# Patient Record
Sex: Female | Born: 2000 | Race: Black or African American | Hispanic: No | Marital: Single | Smoking: Never smoker
Health system: Southern US, Community
[De-identification: ages and names within clinical notes are randomized; demographics above are authoritative.]

## PROBLEM LIST (undated history)

## (undated) DIAGNOSIS — I10 Essential (primary) hypertension: Secondary | ICD-10-CM

## (undated) DIAGNOSIS — F419 Anxiety disorder, unspecified: Secondary | ICD-10-CM

## (undated) DIAGNOSIS — J45909 Unspecified asthma, uncomplicated: Secondary | ICD-10-CM

## (undated) HISTORY — PX: NO PAST SURGERIES: SHX2092

---
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
## 2020-01-19 ENCOUNTER — Other Ambulatory Visit: Payer: Medicaid Other

## 2020-01-26 ENCOUNTER — Other Ambulatory Visit: Payer: Self-pay

## 2020-01-26 ENCOUNTER — Emergency Department: Payer: Medicaid Other

## 2020-01-26 ENCOUNTER — Emergency Department
Admission: EM | Admit: 2020-01-26 | Discharge: 2020-01-27 | Disposition: A | Discharge status: Home / Self Care | Payer: Medicaid Other | Attending: Emergency Medicine | Admitting: Emergency Medicine

## 2020-01-26 ENCOUNTER — Encounter: Payer: Self-pay | Admitting: Emergency Medicine

## 2020-01-26 DIAGNOSIS — S299XXA Unspecified injury of thorax, initial encounter: Secondary | ICD-10-CM | POA: Diagnosis not present

## 2020-01-26 DIAGNOSIS — S0093XA Contusion of unspecified part of head, initial encounter: Secondary | ICD-10-CM | POA: Insufficient documentation

## 2020-01-26 DIAGNOSIS — S3991XA Unspecified injury of abdomen, initial encounter: Secondary | ICD-10-CM | POA: Diagnosis not present

## 2020-01-26 DIAGNOSIS — Y939 Activity, unspecified: Secondary | ICD-10-CM | POA: Diagnosis not present

## 2020-01-26 DIAGNOSIS — N939 Abnormal uterine and vaginal bleeding, unspecified: Secondary | ICD-10-CM | POA: Insufficient documentation

## 2020-01-26 DIAGNOSIS — S0083XA Contusion of other part of head, initial encounter: Secondary | ICD-10-CM

## 2020-01-26 DIAGNOSIS — Y999 Unspecified external cause status: Secondary | ICD-10-CM | POA: Diagnosis not present

## 2020-01-26 DIAGNOSIS — Z79899 Other long term (current) drug therapy: Secondary | ICD-10-CM | POA: Insufficient documentation

## 2020-01-26 DIAGNOSIS — Y929 Unspecified place or not applicable: Secondary | ICD-10-CM | POA: Diagnosis not present

## 2020-01-26 DIAGNOSIS — S0993XA Unspecified injury of face, initial encounter: Secondary | ICD-10-CM | POA: Diagnosis present

## 2020-01-26 LAB — URINALYSIS, COMPLETE (UACMP) WITH MICROSCOPIC
Bacteria, UA: NONE SEEN
Bilirubin Urine: NEGATIVE
Glucose, UA: NEGATIVE mg/dL
Ketones, ur: NEGATIVE mg/dL
Nitrite: NEGATIVE
Protein, ur: 30 mg/dL — AB
Specific Gravity, Urine: 1.031 — ABNORMAL HIGH (ref 1.005–1.030)
pH: 6 (ref 5.0–8.0)

## 2020-01-26 LAB — COMPREHENSIVE METABOLIC PANEL
ALT: 19 U/L (ref 0–44)
AST: 21 U/L (ref 15–41)
Albumin: 3.8 g/dL (ref 3.5–5.0)
Alkaline Phosphatase: 31 U/L — ABNORMAL LOW (ref 38–126)
Anion gap: 6 (ref 5–15)
BUN: 15 mg/dL (ref 6–20)
CO2: 23 mmol/L (ref 22–32)
Calcium: 8.8 mg/dL — ABNORMAL LOW (ref 8.9–10.3)
Chloride: 112 mmol/L — ABNORMAL HIGH (ref 98–111)
Creatinine, Ser: 0.67 mg/dL (ref 0.44–1.00)
GFR calc Af Amer: >60 mL/min (ref >60)
GFR calc non Af Amer: >60 mL/min (ref >60)
Glucose, Bld: 128 mg/dL — ABNORMAL HIGH (ref 70–99)
Potassium: 3.7 mmol/L (ref 3.5–5.1)
Sodium: 141 mmol/L (ref 135–145)
Total Bilirubin: 0.6 mg/dL (ref 0.3–1.2)
Total Protein: 6.2 g/dL — ABNORMAL LOW (ref 6.5–8.1)

## 2020-01-26 LAB — CBC
HCT: 38.5 % (ref 36.0–46.0)
Hemoglobin: 12.9 g/dL (ref 12.0–15.0)
MCH: 29.4 pg (ref 26.0–34.0)
MCHC: 33.5 g/dL (ref 30.0–36.0)
MCV: 87.7 fL (ref 80.0–100.0)
Platelets: 272 10*3/uL (ref 150–400)
RBC: 4.39 MIL/uL (ref 3.87–5.11)
RDW: 12.4 % (ref 11.5–15.5)
WBC: 8.9 10*3/uL (ref 4.0–10.5)
nRBC: 0 % (ref 0.0–0.2)

## 2020-01-26 LAB — POCT PREGNANCY, URINE: Preg Test, Ur: NEGATIVE

## 2020-01-26 LAB — LIPASE, BLOOD: Lipase: 37 U/L (ref 11–51)

## 2020-01-26 MED ORDER — IBUPROFEN 800 MG PO TABS
800.0000 mg | ORAL_TABLET | Freq: Once | ORAL | Status: AC
  Administered 2020-01-27: 800 mg via ORAL
  Filled 2020-01-26: qty 1

## 2020-01-26 MED ORDER — FOSFOMYCIN TROMETHAMINE 3 G PO PACK
3.0000 g | PACK | Freq: Once | ORAL | Status: AC
  Administered 2020-01-27: 3 g via ORAL
  Filled 2020-01-26: qty 3

## 2020-01-26 MED ORDER — IOHEXOL 300 MG/ML  SOLN
100.0000 mL | Freq: Once | INTRAMUSCULAR | Status: AC | PRN
  Administered 2020-01-26: 100 mL via INTRAVENOUS

## 2020-01-26 MED ORDER — HYDROCODONE-ACETAMINOPHEN 5-325 MG PO TABS
1.0000 | ORAL_TABLET | Freq: Once | ORAL | Status: AC
  Administered 2020-01-27: 1 via ORAL
  Filled 2020-01-26: qty 1
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
## 2020-01-27 MED ORDER — HYDROCODONE-ACETAMINOPHEN 5-325 MG PO TABS: 1.0000 | ORAL_TABLET | Freq: Four times a day (QID) | ORAL | 0 refills | Status: DC | PRN

## 2020-01-27 MED ORDER — IBUPROFEN 800 MG PO TABS
800.0000 mg | ORAL_TABLET | Freq: Three times a day (TID) | ORAL | 0 refills | Status: DC | PRN
Start: 2020-01-27 — End: 2020-04-27

## 2020-01-27 NOTE — Discharge Instructions (Signed)
You may take pain medicines as needed (Motrin/Norco #15). Apply ice to affected areas several times daily as needed to reduce swelling. Return to the ER for worsening symptoms, persistent vomiting, lethargy, difficulty breathing or other concerns.

## 2020-02-25 ENCOUNTER — Encounter: Payer: Medicaid Other | Admitting: Obstetrics & Gynecology

## 2020-03-12 ENCOUNTER — Encounter: Payer: Medicaid Other | Admitting: Obstetrics & Gynecology

## 2020-04-26 ENCOUNTER — Encounter: Payer: Self-pay | Admitting: Emergency Medicine

## 2020-04-26 ENCOUNTER — Other Ambulatory Visit: Payer: Self-pay

## 2020-04-26 DIAGNOSIS — O468X1 Other antepartum hemorrhage, first trimester: Secondary | ICD-10-CM | POA: Diagnosis not present

## 2020-04-26 DIAGNOSIS — Z3A09 9 weeks gestation of pregnancy: Secondary | ICD-10-CM | POA: Diagnosis not present

## 2020-04-26 DIAGNOSIS — M791 Myalgia, unspecified site: Secondary | ICD-10-CM | POA: Diagnosis not present

## 2020-04-26 DIAGNOSIS — R109 Unspecified abdominal pain: Secondary | ICD-10-CM | POA: Insufficient documentation

## 2020-04-26 DIAGNOSIS — R0981 Nasal congestion: Secondary | ICD-10-CM | POA: Diagnosis not present

## 2020-04-26 DIAGNOSIS — O418X10 Other specified disorders of amniotic fluid and membranes, first trimester, not applicable or unspecified: Secondary | ICD-10-CM | POA: Insufficient documentation

## 2020-04-26 DIAGNOSIS — J45909 Unspecified asthma, uncomplicated: Secondary | ICD-10-CM | POA: Diagnosis not present

## 2020-04-26 DIAGNOSIS — Z20822 Contact with and (suspected) exposure to covid-19: Secondary | ICD-10-CM | POA: Insufficient documentation

## 2020-04-26 DIAGNOSIS — O219 Vomiting of pregnancy, unspecified: Secondary | ICD-10-CM | POA: Diagnosis present

## 2020-04-26 LAB — URINALYSIS, COMPLETE (UACMP) WITH MICROSCOPIC
Bacteria, UA: NONE SEEN
Bilirubin Urine: NEGATIVE
Glucose, UA: NEGATIVE mg/dL
Hgb urine dipstick: NEGATIVE
Ketones, ur: NEGATIVE mg/dL
Nitrite: NEGATIVE
Protein, ur: NEGATIVE mg/dL
Specific Gravity, Urine: 1.029 (ref 1.005–1.030)
pH: 5 (ref 5.0–8.0)

## 2020-04-26 LAB — COMPREHENSIVE METABOLIC PANEL
ALT: 17 U/L (ref 0–44)
AST: 15 U/L (ref 15–41)
Albumin: 3.9 g/dL (ref 3.5–5.0)
Alkaline Phosphatase: 25 U/L — ABNORMAL LOW (ref 38–126)
Anion gap: 9 (ref 5–15)
BUN: 12 mg/dL (ref 6–20)
CO2: 23 mmol/L (ref 22–32)
Calcium: 9 mg/dL (ref 8.9–10.3)
Chloride: 106 mmol/L (ref 98–111)
Creatinine, Ser: 0.47 mg/dL (ref 0.44–1.00)
GFR calc Af Amer: >60 mL/min (ref >60)
GFR calc non Af Amer: >60 mL/min (ref >60)
Glucose, Bld: 91 mg/dL (ref 70–99)
Potassium: 3.3 mmol/L — ABNORMAL LOW (ref 3.5–5.1)
Sodium: 138 mmol/L (ref 135–145)
Total Bilirubin: 0.9 mg/dL (ref 0.3–1.2)
Total Protein: 6.6 g/dL (ref 6.5–8.1)

## 2020-04-26 LAB — CBC
HCT: 33.6 % — ABNORMAL LOW (ref 36.0–46.0)
Hemoglobin: 11.3 g/dL — ABNORMAL LOW (ref 12.0–15.0)
MCH: 29.6 pg (ref 26.0–34.0)
MCHC: 33.6 g/dL (ref 30.0–36.0)
MCV: 88 fL (ref 80.0–100.0)
Platelets: 223 10*3/uL (ref 150–400)
RBC: 3.82 MIL/uL — ABNORMAL LOW (ref 3.87–5.11)
RDW: 13.2 % (ref 11.5–15.5)
WBC: 7.4 10*3/uL (ref 4.0–10.5)
nRBC: 0 % (ref 0.0–0.2)

## 2020-04-26 LAB — LIPASE, BLOOD: Lipase: 42 U/L (ref 11–51)

## 2020-04-26 LAB — POCT PREGNANCY, URINE: Preg Test, Ur: POSITIVE — AB

## 2020-04-26 NOTE — ED Triage Notes (Signed)
Pt to ED from home c/o abd pain and n/v x4-5 months.  States had vaginal bleeding x1 hr last month but prior to that unsure when last period.  States sexually active, unsure if pregnant and has not taken pregnancy test.  States nauseous throughout the day and unable to keep food down.

## 2020-04-27 ENCOUNTER — Emergency Department: Payer: Medicaid Other

## 2020-04-27 ENCOUNTER — Telehealth: Payer: Self-pay | Admitting: Emergency Medicine

## 2020-04-27 ENCOUNTER — Emergency Department
Admission: EM | Admit: 2020-04-27 | Discharge: 2020-04-27 | Disposition: A | Discharge status: Home / Self Care | Payer: Medicaid Other | Attending: Emergency Medicine | Admitting: Emergency Medicine

## 2020-04-27 ENCOUNTER — Encounter: Payer: Self-pay | Admitting: Radiology

## 2020-04-27 DIAGNOSIS — O4690 Antepartum hemorrhage, unspecified, unspecified trimester: Secondary | ICD-10-CM

## 2020-04-27 DIAGNOSIS — Z20822 Contact with and (suspected) exposure to covid-19: Secondary | ICD-10-CM

## 2020-04-27 DIAGNOSIS — O418X10 Other specified disorders of amniotic fluid and membranes, first trimester, not applicable or unspecified: Secondary | ICD-10-CM

## 2020-04-27 DIAGNOSIS — O468X1 Other antepartum hemorrhage, first trimester: Secondary | ICD-10-CM

## 2020-04-27 DIAGNOSIS — Z3A09 9 weeks gestation of pregnancy: Secondary | ICD-10-CM

## 2020-04-27 LAB — SARS CORONAVIRUS 2 BY RT PCR (HOSPITAL ORDER, PERFORMED IN ~~LOC~~ HOSPITAL LAB): SARS Coronavirus 2: NEGATIVE

## 2020-04-27 LAB — CHLAMYDIA/NGC RT PCR (ARMC ONLY)
Chlamydia Tr: DETECTED — AB
N gonorrhoeae: NOT DETECTED

## 2020-04-27 LAB — HEPATITIS PANEL, ACUTE
HCV Ab: NONREACTIVE
Hep A IgM: NONREACTIVE
Hep B C IgM: NONREACTIVE
Hepatitis B Surface Ag: NONREACTIVE

## 2020-04-27 LAB — ABO/RH: ABO/RH(D): B POS

## 2020-04-27 LAB — HIV ANTIBODY (ROUTINE TESTING W REFLEX): HIV Screen 4th Generation wRfx: NONREACTIVE

## 2020-04-27 LAB — HCG, QUANTITATIVE, PREGNANCY: hCG, Beta Chain, Quant, S: 43150 m[IU]/mL — ABNORMAL HIGH (ref <5)

## 2020-04-27 MED ORDER — PRENATAL VITAMIN 27-0.8 MG PO TABS: 1.0000 | ORAL_TABLET | Freq: Every day | ORAL | 0 refills | Status: DC

## 2020-04-27 MED ORDER — DOXYLAMINE-PYRIDOXINE 10-10 MG PO TBEC: 1.0000 | DELAYED_RELEASE_TABLET | Freq: Two times a day (BID) | ORAL | 0 refills | Status: DC

## 2020-04-27 NOTE — ED Notes (Signed)
Dr Katrinka Blazing at in triage to see pt.

## 2020-04-27 NOTE — ED Notes (Addendum)
Dr Katrinka Blazing to triage to see pt. Called pt, checked outside and bathroom unable to find.

## 2020-04-27 NOTE — Telephone Encounter (Signed)
Called patient to inform of positive chlamydia test and need for treatment.  Per dr Antoine Primas, call in azithromycin 1 gram to patient's pharmacy.  She did not answer and no voicemail.  Will try again.

## 2020-04-27 NOTE — ED Notes (Signed)
Pt being DC from triage area after Dr. Katrinka Blazing saw pt. E-signature page printed out and pt signed on paper while will be sent to medical records to be added to chart.

## 2020-04-27 NOTE — Telephone Encounter (Signed)
Called patient again regarding chlamydia treatment.  No answer and no voicemail.

## 2020-04-27 NOTE — ED Provider Notes (Signed)
[facility] Emergency Department Provider Note  ____________________________________________   First MD Initiated Contact with Patient 04/27/20 [id number]     (approximate)  I have reviewed the triage vital signs and the nursing notes.   HISTORY  Chief Complaint Abdominal Pain, Nausea, and Emesis   HPI Katrina Sherman is a 19 y.o. female with a past medical history of anxiety and anemia who presents for assessment of approximately 1 month of daily nausea and vomiting associated with some bilateral lower back soreness.  Patient also notes she has had some congestion and nonproductive cough over the last several weeks.  No prior similar episodes.  No clear alleviating or aggravating factors.  Patient states she had some vaginal bleeding about 1 month ago but none since then.  She states she is never been pregnant and is not aware if she is pregnant or not.  She states she has been taking some ibuprofen but this has not helped.  She denies any fevers, chills, cough, shortness of breath, chest pain, abdominal pain, vaginal bleeding, discharge, blood in her stool, rash, extremity pain, or other acute complaints.  Denies EtOH use, illegal drug use, or tobacco abuse.  States she is currently's sexually active with one partner.         Past Medical History:  Diagnosis Date  . Anxiety   . Asthma     There are no problems to display for this patient.   History reviewed. No pertinent surgical history.  Prior to Admission medications   Medication Sig Start Date End Date Taking? Authorizing Provider  cetirizine (ZYRTEC ALLERGY) 10 MG tablet Take 1 tablet (10 mg total) by mouth daily for 14 days. 11/19/19 12/03/19  Miguel Aschoff., MD  diphenhydrAMINE (BENADRYL ALLERGY) 25 mg capsule Take 1 capsule (25 mg total) by mouth every 6 (six) hours as needed for itching or allergies. 11/19/19   Miguel Aschoff., MD  Doxylamine-Pyridoxine (DICLEGIS) 10-10 MG TBEC Take 1 tablet by mouth in  the morning and at bedtime. 04/27/20   Gilles Chiquito, MD  Prenatal Vit-Fe Fumarate-FA (PRENATAL VITAMIN) 27-0.8 MG TABS Take 1 tablet by mouth daily. 04/27/20   Gilles Chiquito, MD  famotidine (PEPCID) 20 MG tablet Take 1 tablet (20 mg total) by mouth 2 (two) times daily for 5 days. 05/17/19 09/24/19  Enid Derry, PA-C    Allergies Kiwi extract, Other, and Pollen extract  History reviewed. No pertinent family history.  Social History Social History   Tobacco Use  . Smoking status: Never Smoker  . Smokeless tobacco: Never Used  Substance Use Topics  . Alcohol use: Never  . Drug use: Never    Review of Systems  Review of Systems  Constitutional: Negative for chills and fever.  HENT: Positive for congestion. Negative for sore throat.   Eyes: Negative for pain.  Respiratory: Positive for cough. Negative for stridor.   Cardiovascular: Negative for chest pain.  Gastrointestinal: Positive for nausea and vomiting.  Musculoskeletal: Positive for myalgias ( b/l lower back ).  Skin: Negative for rash.  Neurological: Negative for seizures, loss of consciousness and headaches.  Psychiatric/Behavioral: Negative for suicidal ideas.  All other systems reviewed and are negative.     ____________________________________________   PHYSICAL EXAM:  VITAL SIGNS: ED Triage Vitals  Enc Vitals Group     BP 04/26/20 2235 123/70     Pulse Rate 04/26/20 2235 82     Resp 04/26/20 2235 20     Temp 04/26/20 2235 98.3  F (36.8 C)     Temp Source 04/26/20 2235 Oral     SpO2 04/26/20 2235 98 %     Weight 04/26/20 2235 229 lb 8 oz (104.1 kg)     Height --      Head Circumference --      Peak Flow --      Pain Score 04/26/20 2246 8     Pain Loc --      Pain Edu? --      Excl. in GC? --    Vitals:   04/27/20 0709 04/27/20 0830  BP: (!) 104/93 115/69  Pulse: 84 77  Resp: 18 16  Temp: 98.7 F (37.1 C) 99.5 F (37.5 C)  SpO2: 98% 98%   Physical Exam Vitals and nursing note  reviewed.  Constitutional:      General: She is not in acute distress.    Appearance: Normal appearance. She is well-developed and normal weight.  HENT:     Head: Normocephalic and atraumatic.     Right Ear: External ear normal.     Left Ear: External ear normal.     Nose: Nose normal.     Mouth/Throat:     Mouth: Mucous membranes are moist.  Eyes:     Conjunctiva/sclera: Conjunctivae normal.  Cardiovascular:     Rate and Rhythm: Normal rate and regular rhythm.     Pulses: Normal pulses.     Heart sounds: No murmur heard.   Pulmonary:     Effort: Pulmonary effort is normal. No respiratory distress.     Breath sounds: Normal breath sounds.  Abdominal:     Palpations: Abdomen is soft.     Tenderness: There is no abdominal tenderness. There is no right CVA tenderness or left CVA tenderness.  Musculoskeletal:     Cervical back: Neck supple.  Skin:    General: Skin is warm and dry.  Neurological:     Mental Status: She is alert and oriented to person, place, and time.  Psychiatric:        Mood and Affect: Mood normal.      ____________________________________________   LABS (all labs ordered are listed, but only abnormal results are displayed)  Labs Reviewed  COMPREHENSIVE METABOLIC PANEL - Abnormal; Notable for the following components:      Result Value   Potassium 3.3 (*)    Alkaline Phosphatase 25 (*)    All other components within normal limits  CBC - Abnormal; Notable for the following components:   RBC 3.82 (*)    Hemoglobin 11.3 (*)    HCT 33.6 (*)    All other components within normal limits  URINALYSIS, COMPLETE (UACMP) WITH MICROSCOPIC - Abnormal; Notable for the following components:   Color, Urine YELLOW (*)    APPearance HAZY (*)    Leukocytes,Ua TRACE (*)    All other components within normal limits  HCG, QUANTITATIVE, PREGNANCY - Abnormal; Notable for the following components:   hCG, Beta Chain, Quant, S 43,150 (*)    All other components within  normal limits  POCT PREGNANCY, URINE - Abnormal; Notable for the following components:   Preg Test, Ur POSITIVE (*)    All other components within normal limits  SARS CORONAVIRUS 2 BY RT PCR (HOSPITAL ORDER, PERFORMED IN [facility])  CHLAMYDIA/NGC RT PCR ([facility] ONLY)  LIPASE, BLOOD  HIV ANTIBODY (ROUTINE TESTING W REFLEX)  RPR  HEPATITIS PANEL, ACUTE  POC URINE PREG, ED  ABO/RH  RADIOLOGY   Official radiology report(s): US OB LESS THAN 14 WEEKS WITH OB TRANSVAGINAL  Result Date: 04/27/2020 CLINICAL DATA:  Vaginal bleeding. EXAM: OBSTETRIC <14 WK US AND TRANSVAGINAL OB US TECHNIQUE: Both transabdominal and transvaginal ultrasound examinations were performed for complete evaluation of the gestation as well as the maternal uterus, adnexal regions, and pelvic cul-de-sac. Transvaginal technique was performed to assess early pregnancy. COMPARISON:  CT 01/26/2020. FINDINGS: Intrauterine gestational sac: Present Yolk sac:  Present Embryo:  Present Cardiac Activity: Present Heart Rate: 162 bpm CRL: 28.4 mm   9 w   5 d                  US EDC: 11/26/2019 Subchorionic hemorrhage: Two small separate subchorionic hemorrhages are noted one measuring 3.2 cm in maximum diameter the other measuring 2.1 cm. Maternal uterus/adnexae: Ovaries appear normal. Trace free pelvic fluid. IMPRESSION: 1.  Single viable intrauterine pregnancy at 9 weeks 5 days. 2. Two small separate subchorionic hemorrhages are noted one measuring 3.2 cm in maximum diameter, the other measuring 2.1 cm. Follow-up exams can be obtained to demonstrate resolution. 3.  Trace free pelvic fluid. Electronically Signed   By: Maisie Fushomas  Register   On: 04/27/2020 06:16    ____________________________________________   PROCEDURES  Procedure(s) performed (including Critical Care):  Procedures   ____________________________________________   INITIAL IMPRESSION / ASSESSMENT AND PLAN / ED COURSE        Patient  presents with above-stated history exam for assessment of nausea and vomiting associated with some soreness in her lower back over the past month.  Afebrile and hemodynamically stable on arrival.  Exam as above.  Overall patient's history, exam, and ED work-up is most consistent with a viable IUP at 9 weeks and 5 days by ultrasound measurement associated with 2 small subchorionic hemorrhages.  Patient does endorse history of nausea and vomiting with past month she does not appear to be clinically dehydrated on exam and has been able to tolerate p.o. between vomiting episodes.  Patient is noted to be mildly hypokalemic but otherwise has no significant electrolyte or metabolic derangements.  UA is not consistent with infectious process and there are no ketones noted.  Patient is noted to have a nonproductive cough and congestion over the last couple weeks lung sounds are unremarkable she has no fever or other findings on history exam to suggest bacterial pneumonia.  Likely URI.  COVID-19 PCR sent.    Discussed at length with the patient her pregnancy as well as findings of subchorionic hemorrhage.  Routine counseling provided by myself including cessation of all NSAIDs, initiating prenatal vitamins, and using Diclegis as needed for nausea and vomiting.  Discussed importance of obtaining close outpatient OB/GYN follow-up.  Discussed avoiding tobacco and EtOH use.  Discussed that we will send routine STD testing today but that she should follow-up with her OB/GYN the results of the studies.   ____________________________________________   FINAL CLINICAL IMPRESSION(S) / ED DIAGNOSES  Final diagnoses:  [redacted] weeks gestation of pregnancy  Subchorionic hemorrhage of placenta in first trimester, single or unspecified fetus  Person under investigation for COVID-19     ED Discharge Orders         Ordered    Prenatal Vit-Fe Fumarate-FA (PRENATAL VITAMIN) 27-0.8 MG TABS  Daily     Discontinue  Reprint      04/27/20 0823    Doxylamine-Pyridoxine (DICLEGIS) 10-10 MG TBEC  2 times daily     Discontinue  Reprint  04/27/20 0827           Note:  This document was prepared using Dragon voice recognition software and may include unintentional dictation errors.   Gilles Chiquito, MD 04/27/20 [phone]

## 2020-04-29 LAB — RPR: RPR Ser Ql: NONREACTIVE — AB

## 2020-05-01 ENCOUNTER — Observation Stay
Admission: EM | Admit: 2020-05-01 | Discharge: 2020-05-01 | Disposition: A | Discharge status: Home / Self Care | Payer: Medicaid Other | Attending: Obstetrics and Gynecology | Admitting: Obstetrics and Gynecology

## 2020-05-01 ENCOUNTER — Encounter: Payer: Self-pay | Admitting: *Deleted

## 2020-05-01 ENCOUNTER — Emergency Department
Admission: EM | Admit: 2020-05-01 | Discharge: 2020-05-01 | Disposition: A | Discharge status: Home / Self Care | Payer: Medicaid Other | Attending: Emergency Medicine | Admitting: Emergency Medicine

## 2020-05-01 DIAGNOSIS — O219 Vomiting of pregnancy, unspecified: Secondary | ICD-10-CM | POA: Diagnosis present

## 2020-05-01 DIAGNOSIS — O360131 Maternal care for anti-D [Rh] antibodies, third trimester, fetus 1: Secondary | ICD-10-CM | POA: Insufficient documentation

## 2020-05-01 DIAGNOSIS — O99013 Anemia complicating pregnancy, third trimester: Secondary | ICD-10-CM | POA: Insufficient documentation

## 2020-05-01 DIAGNOSIS — D649 Anemia, unspecified: Secondary | ICD-10-CM | POA: Insufficient documentation

## 2020-05-01 DIAGNOSIS — F909 Attention-deficit hyperactivity disorder, unspecified type: Secondary | ICD-10-CM | POA: Diagnosis not present

## 2020-05-01 DIAGNOSIS — Z79899 Other long term (current) drug therapy: Secondary | ICD-10-CM | POA: Insufficient documentation

## 2020-05-01 DIAGNOSIS — Z6791 Unspecified blood type, Rh negative: Secondary | ICD-10-CM | POA: Diagnosis not present

## 2020-05-01 DIAGNOSIS — I10 Essential (primary) hypertension: Secondary | ICD-10-CM | POA: Diagnosis not present

## 2020-05-01 DIAGNOSIS — O99810 Abnormal glucose complicating pregnancy: Secondary | ICD-10-CM | POA: Insufficient documentation

## 2020-05-01 DIAGNOSIS — O99323 Drug use complicating pregnancy, third trimester: Secondary | ICD-10-CM | POA: Insufficient documentation

## 2020-05-01 DIAGNOSIS — O99343 Other mental disorders complicating pregnancy, third trimester: Secondary | ICD-10-CM | POA: Diagnosis not present

## 2020-05-01 DIAGNOSIS — O210 Mild hyperemesis gravidarum: Secondary | ICD-10-CM | POA: Insufficient documentation

## 2020-05-01 DIAGNOSIS — F411 Generalized anxiety disorder: Secondary | ICD-10-CM | POA: Insufficient documentation

## 2020-05-01 DIAGNOSIS — F329 Major depressive disorder, single episode, unspecified: Secondary | ICD-10-CM | POA: Diagnosis not present

## 2020-05-01 DIAGNOSIS — J45909 Unspecified asthma, uncomplicated: Secondary | ICD-10-CM | POA: Insufficient documentation

## 2020-05-01 DIAGNOSIS — O26853 Spotting complicating pregnancy, third trimester: Secondary | ICD-10-CM | POA: Diagnosis not present

## 2020-05-01 DIAGNOSIS — F1210 Cannabis abuse, uncomplicated: Secondary | ICD-10-CM | POA: Insufficient documentation

## 2020-05-01 DIAGNOSIS — Z3A35 35 weeks gestation of pregnancy: Secondary | ICD-10-CM | POA: Diagnosis not present

## 2020-05-01 DIAGNOSIS — Z3A09 9 weeks gestation of pregnancy: Secondary | ICD-10-CM | POA: Diagnosis not present

## 2020-05-01 DIAGNOSIS — Z3490 Encounter for supervision of normal pregnancy, unspecified, unspecified trimester: Secondary | ICD-10-CM

## 2020-05-01 HISTORY — DX: Essential (primary) hypertension: I10

## 2020-05-01 MED ORDER — PROMETHAZINE HCL 12.5 MG PO TABS
12.5000 mg | ORAL_TABLET | Freq: Four times a day (QID) | ORAL | 0 refills | Status: DC | PRN
Start: 2020-05-01 — End: 2021-11-24

## 2020-05-01 NOTE — ED Triage Notes (Signed)
Patient sent back to ED from [facility] for evaluation by emergency room. Patient initially stated she had EDC of 08/16 however it was determined she was less than 20 weeks. Patient wants to be evaluated for nausea and vomiting.

## 2020-05-01 NOTE — ED Provider Notes (Signed)
[facility] Emergency Department Provider Note   ____________________________________________    I have reviewed the triage vital signs and the nursing notes.   HISTORY  Chief Complaint Nausea vomiting    HPI Katrina Sherman is a 19 y.o. female who reports she is approximately [redacted] weeks pregnant who presents with complaints of nausea vomiting.  Patient reports symptoms started last week.  Recently seen in the emergency department, had ultrasound performed during that time.  Was prescribed likely just she reports this has not been fully successful for her.  She does not have appointment with [facility] until next month.  No significant abdominal pain this time.  No vaginal bleeding currently.  Past Medical History:  Diagnosis Date  . Anxiety   . Asthma   . Hypertension     Patient Active Problem List   Diagnosis Date Noted  . Pregnancy 05/01/2020    History reviewed. No pertinent surgical history.  Prior to Admission medications   Medication Sig Start Date End Date Taking? Authorizing Provider  cetirizine (ZYRTEC ALLERGY) 10 MG tablet Take 1 tablet (10 mg total) by mouth daily for 14 days. 11/19/19 12/03/19  Miguel Aschoff., MD  diphenhydrAMINE (BENADRYL ALLERGY) 25 mg capsule Take 1 capsule (25 mg total) by mouth every 6 (six) hours as needed for itching or allergies. 11/19/19   Miguel Aschoff., MD  Doxylamine-Pyridoxine (DICLEGIS) 10-10 MG TBEC Take 1 tablet by mouth in the morning and at bedtime. 04/27/20   Gilles Chiquito, MD  Prenatal Vit-Fe Fumarate-FA (PRENATAL VITAMIN) 27-0.8 MG TABS Take 1 tablet by mouth daily. 04/27/20   Gilles Chiquito, MD  promethazine (PHENERGAN) 12.5 MG tablet Take 1 tablet (12.5 mg total) by mouth every 6 (six) hours as needed for nausea or vomiting. 05/01/20   Jene Every, MD  famotidine (PEPCID) 20 MG tablet Take 1 tablet (20 mg total) by mouth 2 (two) times daily for 5 days. 05/17/19 09/24/19  Enid Derry, PA-C      Allergies Kiwi extract, Other, and Pollen extract  History reviewed. No pertinent family history.  Social History Social History   Tobacco Use  . Smoking status: Never Smoker  . Smokeless tobacco: Never Used  Substance Use Topics  . Alcohol use: Never  . Drug use: Never    Review of Systems  Constitutional: No fever/chills Eyes: No visual changes.  ENT: No sore throat. Cardiovascular: Denies chest pain. Respiratory: Denies shortness of breath. Gastrointestinal: As above Genitourinary: As above Musculoskeletal: Negative for back pain. Skin: Negative for rash. Neurological: Negative for headaches or weakness   ____________________________________________   PHYSICAL EXAM:  VITAL SIGNS: ED Triage Vitals  Enc Vitals Group     BP 05/01/20 0524 113/64     Pulse Rate 05/01/20 0524 (!) 101     Resp 05/01/20 0524 20     Temp 05/01/20 0524 99.3 F (37.4 C)     Temp Source 05/01/20 0524 Oral     SpO2 05/01/20 0524 99 %     Weight 05/01/20 0526 103.9 kg (229 lb)     Height 05/01/20 0526 1.626 m (5\' 4" )     Head Circumference --      Peak Flow --      Pain Score 05/01/20 0525 9     Pain Loc --      Pain Edu? --      Excl. in GC? --     Constitutional: Alert and oriented.  Eyes: Conjunctivae are normal.  Mouth/Throat: Mucous membranes are moist.   Neck:  Painless ROM Cardiovascular: Normal rate, regular rhythm.  Good peripheral circulation. Respiratory: Normal respiratory effort.  No retractions.  Gastrointestinal: Soft and nontender. No distention.    Musculoskeletal: No lower extremity tenderness nor edema.  Warm and well perfused Neurologic:  Normal speech and language. No gross focal neurologic deficits are appreciated.  Skin:  Skin is warm, dry and intact. No rash noted. Psychiatric: Mood and affect are normal. Speech and behavior are normal.  ____________________________________________   LABS (all labs ordered are listed, but only abnormal results  are displayed)  Labs Reviewed - No data to display ____________________________________________  EKG  None ____________________________________________  RADIOLOGY  Reviewed ultrasound from several days ago ____________________________________________   PROCEDURES  Procedure(s) performed: No  Procedures   Critical Care performed: No ____________________________________________   INITIAL IMPRESSION / ASSESSMENT AND PLAN / ED COURSE  Pertinent labs & imaging results that were available during my care of the patient were reviewed by me and considered in my medical decision making (see chart for details).  Patient presents with nausea and vomiting in the first trimester of pregnancy most consistent with morning sickness.  Differential also includes viral gastritis, no accompanying viral symptoms to suggest that.  Overall well-appearing here in the emergency department.  Lab work performed recently was unremarkable upon review of medical records.  No indication to repeat ultrasound.  Will trial Phenergan for her, recommend close follow-up with OB    ____________________________________________   FINAL CLINICAL IMPRESSION(S) / ED DIAGNOSES  Final diagnoses:  Morning sickness        Note:  This document was prepared using Dragon voice recognition software and may include unintentional dictation errors.   Jene Every, MD 05/01/20 1352

## 2020-05-01 NOTE — Progress Notes (Signed)
Pt is 9 weeks and 5 days as of 04/27/20. Pt will be discharged and may be seen in the ED if needed. PT discharged.

## 2020-05-01 NOTE — OB Triage Note (Addendum)
Recvd pt from ED. Pt states contractions started around 0000 and noticed some LOF and bleeding. Pt states her due date is 04/26/20. Before arriving in the room, pt came to nurse station to make sure her support person would be asked to leave before any questions were asked. Pt states she was here on Aug 16 and waited all night in the ED and then they gave her some medication to take. Previous note states she is [redacted] weeks pregnant. Pt says she was seen two months ago in the office and states she doesn't know what the office is called. Pt believes she is 8 months pregnant. Rates pain a 10 out of 10. Pt now states she had an appt on 03/30/20 at [facility]. Pt states she has taken medicine everyday since leaving the hospital but doesn't know any of the names. States her breasts have been leaking and she is having a boy. Reviewed chart and pt has not been seen by [facility].

## 2020-05-05 NOTE — Telephone Encounter (Signed)
I called the patient back as she did not answer the letter I sent several days ago.  Still no answer and no voicemail.

## 2020-05-24 ENCOUNTER — Encounter: Payer: Medicaid Other | Admitting: Obstetrics and Gynecology

## 2020-06-08 ENCOUNTER — Encounter: Payer: Medicaid Other | Admitting: Advanced Practice Midwife

## 2020-06-16 ENCOUNTER — Encounter: Payer: Medicaid Other | Admitting: Obstetrics and Gynecology

## 2020-06-17 ENCOUNTER — Encounter: Payer: Medicaid Other | Admitting: Advanced Practice Midwife

## 2020-06-20 ENCOUNTER — Emergency Department: Payer: Medicaid Other

## 2020-06-20 ENCOUNTER — Other Ambulatory Visit: Payer: Self-pay

## 2020-06-20 ENCOUNTER — Emergency Department
Admission: EM | Admit: 2020-06-20 | Discharge: 2020-06-20 | Disposition: A | Discharge status: Home / Self Care | Payer: Medicaid Other | Attending: Emergency Medicine | Admitting: Emergency Medicine

## 2020-06-20 DIAGNOSIS — R1084 Generalized abdominal pain: Secondary | ICD-10-CM | POA: Insufficient documentation

## 2020-06-20 DIAGNOSIS — M25561 Pain in right knee: Secondary | ICD-10-CM | POA: Diagnosis not present

## 2020-06-20 DIAGNOSIS — Z3A00 Weeks of gestation of pregnancy not specified: Secondary | ICD-10-CM | POA: Diagnosis not present

## 2020-06-20 DIAGNOSIS — Z5321 Procedure and treatment not carried out due to patient leaving prior to being seen by health care provider: Secondary | ICD-10-CM | POA: Diagnosis not present

## 2020-06-20 DIAGNOSIS — O26899 Other specified pregnancy related conditions, unspecified trimester: Secondary | ICD-10-CM | POA: Insufficient documentation

## 2020-06-20 DIAGNOSIS — R109 Unspecified abdominal pain: Secondary | ICD-10-CM

## 2020-06-20 LAB — COMPREHENSIVE METABOLIC PANEL
ALT: 18 U/L (ref 0–44)
AST: 17 U/L (ref 15–41)
Albumin: 3.4 g/dL — ABNORMAL LOW (ref 3.5–5.0)
Alkaline Phosphatase: 20 U/L — ABNORMAL LOW (ref 38–126)
Anion gap: 9 (ref 5–15)
BUN: 10 mg/dL (ref 6–20)
CO2: 20 mmol/L — ABNORMAL LOW (ref 22–32)
Calcium: 8.9 mg/dL (ref 8.9–10.3)
Chloride: 106 mmol/L (ref 98–111)
Creatinine, Ser: 0.5 mg/dL (ref 0.44–1.00)
GFR, Estimated: >60 mL/min (ref >60)
Glucose, Bld: 99 mg/dL (ref 70–99)
Potassium: 3.5 mmol/L (ref 3.5–5.1)
Sodium: 135 mmol/L (ref 135–145)
Total Bilirubin: 0.4 mg/dL (ref 0.3–1.2)
Total Protein: 6.5 g/dL (ref 6.5–8.1)

## 2020-06-20 LAB — CBC
HCT: 32.4 % — ABNORMAL LOW (ref 36.0–46.0)
Hemoglobin: 11.2 g/dL — ABNORMAL LOW (ref 12.0–15.0)
MCH: 30.2 pg (ref 26.0–34.0)
MCHC: 34.6 g/dL (ref 30.0–36.0)
MCV: 87.3 fL (ref 80.0–100.0)
Platelets: 218 10*3/uL (ref 150–400)
RBC: 3.71 MIL/uL — ABNORMAL LOW (ref 3.87–5.11)
RDW: 12.5 % (ref 11.5–15.5)
WBC: 6.9 10*3/uL (ref 4.0–10.5)
nRBC: 0 % (ref 0.0–0.2)

## 2020-06-20 LAB — HCG, QUANTITATIVE, PREGNANCY: hCG, Beta Chain, Quant, S: 14681 m[IU]/mL — ABNORMAL HIGH (ref <5)

## 2020-06-20 LAB — LIPASE, BLOOD: Lipase: 40 U/L (ref 11–51)

## 2020-06-20 NOTE — ED Triage Notes (Addendum)
Ambulatory to triage c/o generalized abd pain, right knee pain. Reports falling 3-4 days go. Denies n/v/d. NAD noted at this time. Pt currently pregnant and hx of subchorionic hemorrhage noted on ultrasound in previous visit in august.

## 2020-06-20 NOTE — ED Notes (Signed)
FIRST NURSE NOTE:  Pt wanting to leave, asking for a doctor's note advised patient that she would not be given a doctor's note because she has not been seen by a provider.  Advised patient that she can leave and does not have to stay to be seen if she does not want to.

## 2020-06-21 ENCOUNTER — Telehealth: Payer: Self-pay | Admitting: Emergency Medicine

## 2020-06-21 ENCOUNTER — Encounter: Payer: Self-pay | Admitting: *Deleted

## 2020-06-21 ENCOUNTER — Emergency Department
Admission: EM | Admit: 2020-06-21 | Discharge: 2020-06-21 | Disposition: A | Discharge status: Home / Self Care | Payer: Medicaid Other | Attending: Emergency Medicine | Admitting: Emergency Medicine

## 2020-06-21 ENCOUNTER — Telehealth: Payer: Self-pay | Admitting: Obstetrics and Gynecology

## 2020-06-21 DIAGNOSIS — R109 Unspecified abdominal pain: Secondary | ICD-10-CM | POA: Insufficient documentation

## 2020-06-21 DIAGNOSIS — O10012 Pre-existing essential hypertension complicating pregnancy, second trimester: Secondary | ICD-10-CM | POA: Diagnosis not present

## 2020-06-21 DIAGNOSIS — Z3A17 17 weeks gestation of pregnancy: Secondary | ICD-10-CM | POA: Diagnosis not present

## 2020-06-21 DIAGNOSIS — O26892 Other specified pregnancy related conditions, second trimester: Secondary | ICD-10-CM | POA: Diagnosis present

## 2020-06-21 DIAGNOSIS — J45909 Unspecified asthma, uncomplicated: Secondary | ICD-10-CM | POA: Insufficient documentation

## 2020-06-21 DIAGNOSIS — W010XXA Fall on same level from slipping, tripping and stumbling without subsequent striking against object, initial encounter: Secondary | ICD-10-CM | POA: Insufficient documentation

## 2020-06-21 DIAGNOSIS — O26899 Other specified pregnancy related conditions, unspecified trimester: Secondary | ICD-10-CM

## 2020-06-21 DIAGNOSIS — M25561 Pain in right knee: Secondary | ICD-10-CM | POA: Diagnosis not present

## 2020-06-21 DIAGNOSIS — W19XXXD Unspecified fall, subsequent encounter: Secondary | ICD-10-CM

## 2020-06-21 DIAGNOSIS — O9A212 Injury, poisoning and certain other consequences of external causes complicating pregnancy, second trimester: Secondary | ICD-10-CM | POA: Diagnosis not present

## 2020-06-21 DIAGNOSIS — O99512 Diseases of the respiratory system complicating pregnancy, second trimester: Secondary | ICD-10-CM | POA: Diagnosis not present

## 2020-06-21 DIAGNOSIS — O368320 Maternal care for abnormalities of the fetal heart rate or rhythm, second trimester, not applicable or unspecified: Secondary | ICD-10-CM | POA: Diagnosis not present

## 2020-06-21 DIAGNOSIS — O368390 Maternal care for abnormalities of the fetal heart rate or rhythm, unspecified trimester, not applicable or unspecified: Secondary | ICD-10-CM

## 2020-06-21 LAB — COMPREHENSIVE METABOLIC PANEL
ALT: 20 U/L (ref 0–44)
AST: 17 U/L (ref 15–41)
Albumin: 3.5 g/dL (ref 3.5–5.0)
Alkaline Phosphatase: 24 U/L — ABNORMAL LOW (ref 38–126)
Anion gap: 9 (ref 5–15)
BUN: 15 mg/dL (ref 6–20)
CO2: 18 mmol/L — ABNORMAL LOW (ref 22–32)
Calcium: 9.1 mg/dL (ref 8.9–10.3)
Chloride: 108 mmol/L (ref 98–111)
Creatinine, Ser: 0.53 mg/dL (ref 0.44–1.00)
GFR, Estimated: >60 mL/min (ref >60)
Glucose, Bld: 107 mg/dL — ABNORMAL HIGH (ref 70–99)
Potassium: 3.6 mmol/L (ref 3.5–5.1)
Sodium: 135 mmol/L (ref 135–145)
Total Bilirubin: 0.5 mg/dL (ref 0.3–1.2)
Total Protein: 6.7 g/dL (ref 6.5–8.1)

## 2020-06-21 LAB — CBC WITH DIFFERENTIAL/PLATELET
Abs Immature Granulocytes: 0.04 10*3/uL (ref 0.00–0.07)
Basophils Absolute: 0 10*3/uL (ref 0.0–0.1)
Basophils Relative: 0 %
Eosinophils Absolute: 0 10*3/uL (ref 0.0–0.5)
Eosinophils Relative: 0 %
HCT: 32.9 % — ABNORMAL LOW (ref 36.0–46.0)
Hemoglobin: 11.4 g/dL — ABNORMAL LOW (ref 12.0–15.0)
Immature Granulocytes: 0 %
Lymphocytes Relative: 26 %
Lymphs Abs: 2.3 10*3/uL (ref 0.7–4.0)
MCH: 29.9 pg (ref 26.0–34.0)
MCHC: 34.7 g/dL (ref 30.0–36.0)
MCV: 86.4 fL (ref 80.0–100.0)
Monocytes Absolute: 0.6 10*3/uL (ref 0.1–1.0)
Monocytes Relative: 6 %
Neutro Abs: 6 10*3/uL (ref 1.7–7.7)
Neutrophils Relative %: 68 %
Platelets: 230 10*3/uL (ref 150–400)
RBC: 3.81 MIL/uL — ABNORMAL LOW (ref 3.87–5.11)
RDW: 12.4 % (ref 11.5–15.5)
WBC: 8.9 10*3/uL (ref 4.0–10.5)
nRBC: 0 % (ref 0.0–0.2)

## 2020-06-21 LAB — ANTIBODY SCREEN: Antibody Screen: NEGATIVE

## 2020-06-21 MED ORDER — LIDOCAINE 5 % EX PTCH
1.0000 | MEDICATED_PATCH | Freq: Once | CUTANEOUS | Status: DC
  Administered 2020-06-21: 1 via TRANSDERMAL
  Filled 2020-06-21: qty 1

## 2020-06-21 MED ORDER — AZITHROMYCIN 1 G PO PACK
1.0000 g | PACK | Freq: Once | ORAL | Status: AC
  Administered 2020-06-21: 1 g via ORAL
  Filled 2020-06-21: qty 1

## 2020-06-21 NOTE — Telephone Encounter (Signed)
Called patient due to lwot to inquire about condition and follow up plans. She says she does not have OB doctor and no prenatal care.  I asked why, and she told me that everytime she makes appointment they get the days messed up.  Says they are always "booked" for the day when she gets there.  Says she has been trying to go to [facility], although she has no appointment.  I explained the ultrasound and recommended she return here because she has no OB.  She stated she was not going to wait 6 hours.  I told her that I would recommend she be seen today, even if she has to wait.  She said okay.

## 2020-06-21 NOTE — ED Triage Notes (Signed)
Pt ambulatory to triage.  Pt has abd pain and right knee pain.  Pt fell 3-4 days ago.  No vag bleeding.  No back pain no urinary sx.  Pt was seen here yesterday but did not see a provider.  Pt was called today and told to return to ER for eval.    Pt alert.  Speech clear.  This RN unable to get fetal heart tones.  Dr Derrill Kay aware.

## 2020-06-21 NOTE — ED Notes (Signed)
Pt unable to sign for d/c. Pt verbalizes d/c instructions at this time with no further questions.

## 2020-06-21 NOTE — Telephone Encounter (Signed)
Patient called me back again.  She says she tried [facility] again and they are booked for today.   She said if she came to the ED she was not going to wait 6 hours.  I told her that I could not say how long she would have to wait, but that she would be triaged and checked when she arrived to determine that.  She again said she was not going to wait and that she had a funeral to go to this afternoon.  She hung up on me after giving an opinion about the hospital.

## 2020-06-21 NOTE — Discharge Instructions (Signed)
You have an appointment scheduled with maternal-fetal medicine here at [facility] on 10/14 at 9 AM.  Their address is [address], [address] in the medical arts building.  Please return to the ER for reevaluation if you have any worsening pain or vaginal bleeding.

## 2020-06-21 NOTE — Telephone Encounter (Signed)
Patient was seen at ED yesterday, had imaging, and said she later left because of the long wait. Patient called today for same-day appointment, which we did not have, and used foul language with Tonya. I spoke to the patient, and asked Dr. Bonney Aid to review her chart, who said she should go to the ED and needed a MFM consult. I let the patient know that Dr. Bonney Aid said she needed to go to the ED, and encouraged her to go and to stay there even if there was a long wait. Patient wanted to know whether "it was bad" and I advised I am not clinical and was not qualified to answer any questions but that Dr. Bonney Aid had advised that she go to the ED so she should follow his recommendation. Patient said her twin's funeral is today; condolences given. Patient said she might go to [facility] because she thought they would get her in and out, and I encouraged her to go to whichever ED she was most comfortable with. Patient said she would go.

## 2020-06-21 NOTE — ED Notes (Signed)
This RN and Dr. Larinda Buttery at bedside. [place] at bedside. Fetal heart tones assessed on [place] reading with HR reading in 70s. Small fetal movement visualized on [place].

## 2020-06-21 NOTE — ED Provider Notes (Signed)
[facility] Emergency Department Provider Note   ____________________________________________   First MD Initiated Contact with Patient 06/21/20 1604     (approximate)  I have reviewed the triage vital signs and the nursing notes.   HISTORY  Chief Complaint Abdominal Pain and Knee Pain    HPI Katrina Sherman is a 19 y.o. female, G1, P0 at approximately 17 weeks of pregnancy, who presents to the ED complaining abdominal pain and knee pain.  Patient reports that she had a ground-level fall about 4 days ago where she slipped on some detergent that was on the floor and struck the left side of her abdomen as well as her right knee.  She has been dealing with pain in the right side of her abdomen since then along with some pain in her right knee.  She was seen in the ED yesterday, when she had an ultrasound as well as an x-ray of her right knee, however she left without being seen.  Ultrasound was concerning for fetal bradycardia and patient received phone call today that she needed to be further evaluated.  She spoke with her OB/GYN over the phone, who recommended she be seen in the ED.  She denies any vaginal bleeding or discharge, primarily complains of right knee pain at this time.        Past Medical History:  Diagnosis Date  . Anxiety   . Asthma   . Hypertension     Patient Active Problem List   Diagnosis Date Noted  . Pregnancy 05/01/2020    No past surgical history on file.  Prior to Admission medications   Medication Sig Start Date End Date Taking? Authorizing Provider  cetirizine (ZYRTEC ALLERGY) 10 MG tablet Take 1 tablet (10 mg total) by mouth daily for 14 days. 11/19/19 12/03/19  Miguel Aschoff., MD  diphenhydrAMINE (BENADRYL ALLERGY) 25 mg capsule Take 1 capsule (25 mg total) by mouth every 6 (six) hours as needed for itching or allergies. 11/19/19   Miguel Aschoff., MD  Doxylamine-Pyridoxine (DICLEGIS) 10-10 MG TBEC Take 1 tablet by mouth in the  morning and at bedtime. 04/27/20   Gilles Chiquito, MD  Prenatal Vit-Fe Fumarate-FA (PRENATAL VITAMIN) 27-0.8 MG TABS Take 1 tablet by mouth daily. 04/27/20   Gilles Chiquito, MD  promethazine (PHENERGAN) 12.5 MG tablet Take 1 tablet (12.5 mg total) by mouth every 6 (six) hours as needed for nausea or vomiting. 05/01/20   Jene Every, MD  famotidine (PEPCID) 20 MG tablet Take 1 tablet (20 mg total) by mouth 2 (two) times daily for 5 days. 05/17/19 09/24/19  Enid Derry, PA-C    Allergies Kiwi extract, Other, and Pollen extract  No family history on file.  Social History Social History   Tobacco Use  . Smoking status: Never Smoker  . Smokeless tobacco: Never Used  Substance Use Topics  . Alcohol use: Never  . Drug use: Never    Review of Systems  Constitutional: No fever/chills Eyes: No visual changes. ENT: No sore throat. Cardiovascular: Denies chest pain. Respiratory: Denies shortness of breath. Gastrointestinal: Positive for abdominal pain.  No nausea, no vomiting.  No diarrhea.  No constipation. Genitourinary: Negative for dysuria. Musculoskeletal: Negative for back pain.  Positive for right knee pain. Skin: Negative for rash. Neurological: Negative for headaches, focal weakness or numbness.  ____________________________________________   PHYSICAL EXAM:  VITAL SIGNS: ED Triage Vitals  Enc Vitals Group     BP 06/21/20 1523 133/70  Pulse Rate 06/21/20 1523 78     Resp 06/21/20 1523 18     Temp 06/21/20 1523 98.5 F (36.9 C)     Temp Source 06/21/20 1523 Oral     SpO2 06/21/20 1523 97 %     Weight --      Height --      Head Circumference --      Peak Flow --      Pain Score 06/21/20 1532 8     Pain Loc --      Pain Edu? --      Excl. in GC? --     Constitutional: Alert and oriented. Eyes: Conjunctivae are normal. Head: Atraumatic. Nose: No congestion/rhinnorhea. Mouth/Throat: Mucous membranes are moist. Neck: Normal ROM Cardiovascular: Normal  rate, regular rhythm. Grossly normal heart sounds. Respiratory: Normal respiratory effort.  No retractions. Lungs CTAB. Gastrointestinal: Gravid abdomen, soft and nontender. No distention. Genitourinary: deferred Musculoskeletal: Diffuse tenderness of right knee with no obvious deformity, otherwise no extremity tenderness. Neurologic:  Normal speech and language. No gross focal neurologic deficits are appreciated. Skin:  Skin is warm, dry and intact. No rash noted. Psychiatric: Mood and affect are normal. Speech and behavior are normal.  ____________________________________________   LABS (all labs ordered are listed, but only abnormal results are displayed)  Labs Reviewed  CBC WITH DIFFERENTIAL/PLATELET - Abnormal; Notable for the following components:      Result Value   RBC 3.81 (*)    Hemoglobin 11.4 (*)    HCT 32.9 (*)    All other components within normal limits  COMPREHENSIVE METABOLIC PANEL - Abnormal; Notable for the following components:   CO2 18 (*)    Glucose, Bld 107 (*)    Alkaline Phosphatase 24 (*)    All other components within normal limits  SJOGRENS SYNDROME-A EXTRACTABLE NUCLEAR ANTIBODY  SJOGRENS SYNDROME-B EXTRACTABLE NUCLEAR ANTIBODY  ANTIBODY SCREEN     PROCEDURES  Procedure(s) performed (including Critical Care):  Ultrasound ED OB Pelvic  Date/Time: 06/21/2020 4:29 PM Performed by: Chesley Noon, MD Authorized by: Chesley Noon, MD   Procedure details:    Indications: pregnant with no fetal heart sounds     Assess:  Fetal viability and intrauterine pregnancy   Technique:  Transabdominal obstetric (HCG+) exam   Images: not archived    Uterine findings:    Intrauterine pregnancy: identified     Single gestation: identified     Fetal heart rate: identified (Bradycardic at 31)          ____________________________________________   INITIAL IMPRESSION / ASSESSMENT AND PLAN / ED COURSE       19 year old female, G1, P0 at  approximately 17 weeks of pregnancy who presents to the ED complaining of abdominal pain and right knee pain following a fall 4 days ago.  OB ultrasound performed yesterday was concerning for fetal bradycardia with heart rate in the 70s.  Bedside ultrasound today again shows fetal bradycardia but with positive fetal movement.  We will further discuss with OB/GYN and screen labs.  Screening labs are unremarkable, case discussed with Dr. Gaynelle Arabian of OB/GYN, who also spoke with the MFM team.  They would like for patient to follow-up as an outpatient in 3 days at the MFM clinic and she has an appointment scheduled for 9 AM.  Patient was informed of this appointment and was also treated for positive chlamydia test from previous ED visit.  Additional labs drawn at the request of MFM.  Patient is appropriate for discharge home  and was counseled to return to the ED for any new or worsening symptoms, patient agrees with plan.      ____________________________________________   FINAL CLINICAL IMPRESSION(S) / ED DIAGNOSES  Final diagnoses:  Fall, subsequent encounter  Abdominal pain during pregnancy, antepartum  Antepartum fetal bradycardia affecting care of mother  Acute pain of right knee     ED Discharge Orders    None       Note:  This document was prepared using Dragon voice recognition software and may include unintentional dictation errors.   Chesley Noon, MD 06/21/20 [phone]

## 2020-06-22 ENCOUNTER — Other Ambulatory Visit: Payer: Self-pay | Admitting: Family Medicine

## 2020-06-22 DIAGNOSIS — O09892 Supervision of other high risk pregnancies, second trimester: Secondary | ICD-10-CM

## 2020-06-22 DIAGNOSIS — O99212 Obesity complicating pregnancy, second trimester: Secondary | ICD-10-CM

## 2020-06-23 LAB — SJOGRENS SYNDROME-A EXTRACTABLE NUCLEAR ANTIBODY: SSA (Ro) (ENA) Antibody, IgG: <0.2 AI (ref 0.0–0.9)

## 2020-06-23 LAB — SJOGRENS SYNDROME-B EXTRACTABLE NUCLEAR ANTIBODY: SSB (La) (ENA) Antibody, IgG: <0.2 AI (ref 0.0–0.9)

## 2020-06-24 ENCOUNTER — Ambulatory Visit: Payer: Medicaid Other | Admitting: Maternal & Fetal Medicine

## 2020-06-24 ENCOUNTER — Ambulatory Visit (HOSPITAL_BASED_OUTPATIENT_CLINIC_OR_DEPARTMENT_OTHER): Payer: Medicaid Other

## 2020-06-24 ENCOUNTER — Other Ambulatory Visit: Payer: Self-pay

## 2020-06-24 ENCOUNTER — Other Ambulatory Visit
Admission: RE | Admit: 2020-06-24 | Discharge: 2020-06-24 | Disposition: A | Discharge status: Home / Self Care | Payer: Medicaid Other | Source: Ambulatory Visit | Attending: Maternal & Fetal Medicine | Admitting: Maternal & Fetal Medicine

## 2020-06-24 DIAGNOSIS — Z3A18 18 weeks gestation of pregnancy: Secondary | ICD-10-CM

## 2020-06-24 DIAGNOSIS — O99212 Obesity complicating pregnancy, second trimester: Secondary | ICD-10-CM | POA: Diagnosis not present

## 2020-06-24 DIAGNOSIS — O359XX0 Maternal care for (suspected) fetal abnormality and damage, unspecified, not applicable or unspecified: Secondary | ICD-10-CM | POA: Insufficient documentation

## 2020-06-24 DIAGNOSIS — O09892 Supervision of other high risk pregnancies, second trimester: Secondary | ICD-10-CM | POA: Diagnosis not present

## 2020-06-24 DIAGNOSIS — O368390 Maternal care for abnormalities of the fetal heart rate or rhythm, unspecified trimester, not applicable or unspecified: Secondary | ICD-10-CM

## 2020-06-24 DIAGNOSIS — O368320 Maternal care for abnormalities of the fetal heart rate or rhythm, second trimester, not applicable or unspecified: Secondary | ICD-10-CM

## 2020-06-24 DIAGNOSIS — O358XX0 Maternal care for other (suspected) fetal abnormality and damage, not applicable or unspecified: Secondary | ICD-10-CM | POA: Diagnosis not present

## 2020-06-24 NOTE — Progress Notes (Signed)
MFM Consultation:  Requesting provider: Joaquin Music, MD Date of Service: 06/24/20 Reason for request: Fetal bradyarrhythmia  Katrina Sherman is a G1P0 who is here at 18w 0 d at the request of of Dr. Bjorn Pippin for evaluation regarding fetal bradyarrhythmia.   Katrina Sherman reports that she is doing well. She denies sign/symptoms of preterm labor.  She has not had consistent prenatal care due to transportation challenges.  She is dated by a 9 w 5 day ultrasound.   She was initially evaluated on 10/11 in the emergency department due to abdominal pain and knee pain due to a fall 4 days prior. During that visit an fetal ultrasound revealed fetal bradycardia at 70 bpm. SSA./SSB labs were drawn and noted to be normal.   Vitals with BMI 06/24/2020 06/21/2020 06/21/2020  Height 5' 4.5" - -  Weight 227 lbs 8 oz - -  BMI 38.46 - -  Systolic 107 133 888  Diastolic 70 70 70  Pulse 85 78 78   CBC Latest Ref Rng & Units 06/21/2020 06/20/2020 04/26/2020  WBC 4.0 - 10.5 K/uL 8.9 6.9 7.4  Hemoglobin 12.0 - 15.0 g/dL 11.4(L) 11.2(L) 11.3(L)  Hematocrit 36 - 46 % 32.9(L) 32.4(L) 33.6(L)  Platelets 150 - 400 K/uL 230 218 223   CMP Latest Ref Rng & Units 06/21/2020 06/20/2020 04/26/2020  Glucose 70 - 99 mg/dL 280(K) 99 91  BUN 6 - 20 mg/dL 15 10 12   Creatinine 0.44 - 1.00 mg/dL 3.49 1.79  Sodium 135 - 145 mmol/L 135 135 138  Potassium 3.5 - 5.1 mmol/L 3.6 3.5 3.3(L)  Chloride 98 - 111 mmol/L 108 106 106  CO2 22 - 32 mmol/L 18(L) 20(L) 23  Calcium 8.9 - 10.3 mg/dL 9.1 8.9 9.0  Total Protein 6.5 - 8.1 g/dL 6.7 6.5 6.6  Total Bilirubin 0.3 - 1.2 mg/dL 0.5 0.4 0.9  Alkaline Phos 38 - 126 U/L 24(L) 20(L) 25(L)  AST 15 - 41 U/L 17 17 15   ALT 0 - 44 U/L 20 18 17    OB History  Gravida Para Term Preterm AB Living  1 0 0 0 0 0  SAB TAB Ectopic Multiple Live Births  0 0 0 0 0    # Outcome Date GA Lbr Len/2nd Weight Sex Delivery Anes PTL Lv  1 Current            Past Medical History:  Diagnosis  Date  . Anxiety   . Asthma   . Hypertension    No past surgical history on file. Social History   Socioeconomic History  . Marital status: Single    Spouse name: Not on file  . Number of children: Not on file  . Years of education: Not on file  . Highest education level: Not on file  Occupational History  . Not on file  Tobacco Use  . Smoking status: Never Smoker  . Smokeless tobacco: Never Used  Vaping Use  . Vaping Use: Never used  Substance and Sexual Activity  . Alcohol use: Never  . Drug use: Never  . Sexual activity: Not on file  Other Topics Concern  . Not on file  Social History Narrative  . Not on file   Social Determinants of Health   Financial Resource Strain:   . Difficulty of Paying Living Expenses: Not on file  Food Insecurity:   . Worried About 1.50 in the Last Year: Not on file  . Ran Out of Food in the Last  Year: Not on file  Transportation Needs:   . Lack of Transportation (Medical): Not on file  . Lack of Transportation (Non-Medical): Not on file  Physical Activity:   . Days of Exercise per Week: Not on file  . Minutes of Exercise per Session: Not on file  Stress:   . Feeling of Stress : Not on file  Social Connections:   . Frequency of Communication with Friends and Family: Not on file  . Frequency of Social Gatherings with Friends and Family: Not on file  . Attends Religious Services: Not on file  . Active Member of Clubs or Organizations: Not on file  . Attends Banker Meetings: Not on file  . Marital Status: Not on file  Intimate Partner Violence:   . Fear of Current or Ex-Partner: Not on file  . Emotionally Abused: Not on file  . Physically Abused: Not on file  . Sexually Abused: Not on file   Katrina Sherman reports that her grandmother has a history of heart failure. There are no family members with known congenital heart defects.  Imaging: Today we observed a fetus with measurements consistent with dates. We  observed fetal bradycardia suggestive of complete heartblock. In addition, we suspect a fetal ventricular septal defect. Anatomy was seen however, suboptimal views of the fetal anatomy were obtained secondary to fetal position and maternal habitus.   Impression/Plan   We observed a fetal bradyarrhythmia today. Complete atrioventricular (AV) block refers to fetal bradycardia characterized by complete dissociation between atrial (normal) and ventricular (<100beats/min) heart rates. Atrioventricular blocks are rare, affecting 1:20,000 pregnancies. AV block is mainly associated with structural heart anomalies (45-50%) and autoimmune disease (45-55%) (Anti Ro/[place] antbiodies were negative). The most common heart diseases are atrioventricular septal defects and great vessel anomalies, but are also seen in association with corrected transposition of the great vessels. Regardless of the exact etiology, the prognosis of an AV block is extremely poor owing to a high association with hydrops (40-60%), which IS NOT present in this case, and in utero mortality (45-50%). We discussed with your patient that the combination of complete AV block and hydrops does substantially increase the risk of in-utero mortality during the pre-viable period. Therefore we recommend serial hydrops checks at 24 weeks.   Secondly, we observed a ventricular septal defect. The most common forms of VSD (muscular and membranous) are known to close spontaneously, either prenatally or postnatally. The finding of even the smallest muscular VSD appears to increase the risk for additional cardiac abnormalities. Whether the presence of a single, isolated small muscular VSD increases the risk for genetic syndrome remains controversial. Small or moderate muscular defects may close prenatally or within a few years after delivery. Large muscular defects manifest in infancy with tachypnea and failure to thrive and may require surgical closure during the  first year of life. Because muscular defects occur in a heavily trabeculated portion of the ventricular septum, effective surgical closure can be difficult to achieve. Alternative approaches include pulmonary artery banding, followed by removal of the band once the defect has decreased in size; rarely, such VSDs may be closed percutaneously with a device. The long-term prognosis is usually excellent unless delayed surgery has allowed the development of pulmonary vascular disease.  We discussed the option of diagnostic amniotiocentesis including the risk and benefits in comparison the cell free DNA. At this time Katrina Sherman opted for cell free DNA blood draw today.  Lastly, we placed an referral for pediatric echocardiogram to be performed at  first available.   All questions answered  Recommendation: 1) Fetal echocardiogram for first available appointment 2) Repeat growth in 3-4 weeks 3) Initiate weekly hydrops checks at 24-25 weeks.   I spent 30 minutes in face to face consultation.

## 2020-06-24 NOTE — Progress Notes (Signed)
 error

## 2020-06-28 ENCOUNTER — Other Ambulatory Visit: Payer: Self-pay | Admitting: Obstetrics

## 2020-06-28 ENCOUNTER — Other Ambulatory Visit: Payer: Self-pay

## 2020-06-28 ENCOUNTER — Ambulatory Visit (INDEPENDENT_AMBULATORY_CARE_PROVIDER_SITE_OTHER): Payer: Medicaid Other | Admitting: Obstetrics

## 2020-06-28 VITALS — BP 110/70 | Wt 229.0 lb

## 2020-06-28 DIAGNOSIS — O09892 Supervision of other high risk pregnancies, second trimester: Secondary | ICD-10-CM | POA: Insufficient documentation

## 2020-06-28 DIAGNOSIS — O0990 Supervision of high risk pregnancy, unspecified, unspecified trimester: Secondary | ICD-10-CM

## 2020-06-28 DIAGNOSIS — Z3A18 18 weeks gestation of pregnancy: Secondary | ICD-10-CM

## 2020-06-28 LAB — MATERNIT21 PLUS CORE+SCA
Fetal Fraction: 6
Monosomy X (Turner Syndrome): NOT DETECTED
Result (T21): NEGATIVE
Trisomy 13 (Patau syndrome): NEGATIVE
Trisomy 18 (Edwards syndrome): NEGATIVE
Trisomy 21 (Down syndrome): NEGATIVE
XXX (Triple X Syndrome): NOT DETECTED
XXY (Klinefelter Syndrome): NOT DETECTED
XYY (Jacobs Syndrome): NOT DETECTED

## 2020-06-28 NOTE — Progress Notes (Signed)
No concerns,rj  Adv this is her NOB appt - pt doesn't want exam.rj

## 2020-06-28 NOTE — Progress Notes (Signed)
New Obstetric Patient H&P    Chief Complaint: "Desires prenatal care"   History of Present Illness: Patient is a 19 y.o. G1P0 Not Hispanic or Latino female, LMP unknown presents with amenorrhea and positive home pregnancy test. Based on her  LMP, her EDD is Estimated Date of Delivery: 11/25/20 and her EGA is [redacted]w[redacted]d. Cycles are 7. days, regular, and occur approximately every : 28 days. Her last pap smear was NA    She had a urine pregnancy test which was positive approximately 4 week(s)  ago. Her last menstrual period was normal and lasted for  approximately 5 day(s). Since her LMP she claims she has experienced fatigue, nausea, occasional vomiting (better now). She denies vaginal bleeding. Her past medical history is noncontributory. Her prior pregnancies are notable for none  Since her LMP, she admits to the use of tobacco products  no She claims she has gained   no pounds since the start of her pregnancy.  There are cats in the home in the home  no  She admits close contact with children on a regular basis  no  She has had chicken pox in the past no She has had Tuberculosis exposures, symptoms, or previously tested positive for TB   no Current or past history of domestic violence. no  Genetic Screening/Teratology Counseling: (Includes patient, baby's father, or anyone in either family with:)   1. Patient's age >/= 83 at [facility]  no 2. Thalassemia ([place], [place], Mediterranean, or Asian background): MCV<80  no 3. Neural tube defect (meningomyelocele, spina bifida, anencephaly)  no 4. Congenital heart defect  no  5. Down syndrome  no 6. Tay-Sachs (Jewish, [place]  no 7. Canavan's Disease  no 8. Sickle cell disease or trait (African)  no  9. Hemophilia or other blood disorders  no  10. Muscular dystrophy  no  11. Cystic fibrosis  no  12. Huntington's Chorea  no  13. Mental retardation/autism  no 14. Other inherited genetic or chromosomal disorder  no 15. Maternal  metabolic disorder (DM, PKU, etc)  no 16. Patient or FOB with a child with a birth defect not listed above no  16a. Patient or FOB with a birth defect themselves no 17. Recurrent pregnancy loss, or stillbirth  no  18. Any medications since LMP other than prenatal vitamins (include vitamins, supplements, OTC meds, drugs, alcohol)  Yes, diclegis 19. Any other genetic/environmental exposure to discuss  no  Infection History:   1. Lives with someone with TB or TB exposed  no  2. Patient or partner has history of genital herpes  no 3. Rash or viral illness since LMP  no 4. History of STI (GC, CT, HPV, syphilis, HIV)  Yes chlamydia 5. History of recent travel :  no  Other pertinent information:  no     Review of Systems:10 point review of systems negative unless otherwise noted in HPI  Past Medical History:  Past Medical History:  Diagnosis Date   Anxiety    Asthma    Hypertension     Past Surgical History:  No past surgical history on file.  Gynecologic History: No LMP recorded (lmp unknown). Patient is pregnant.  Obstetric History: G1P0  Family History:  No family history on file.  Social History:  Social History   Socioeconomic History   Marital status: Single    Spouse name: Not on file   Number of children: Not on file   Years of education: Not on  file   Highest education level: Not on file  Occupational History   Not on file  Tobacco Use   Smoking status: Never Smoker   Smokeless tobacco: Never Used  Vaping Use   Vaping Use: Never used  Substance and Sexual Activity   Alcohol use: Never   Drug use: Never   Sexual activity: Not on file  Other Topics Concern   Not on file  Social History Narrative   Not on file   Social Determinants of Health   Financial Resource Strain:    Difficulty of Paying Living Expenses: Not on file  Food Insecurity:    Worried About Running Out of Food in the Last Year: Not on file   Ran Out of Food in the  Last Year: Not on file  Transportation Needs:    Lack of Transportation (Medical): Not on file   Lack of Transportation (Non-Medical): Not on file  Physical Activity:    Days of Exercise per Week: Not on file   Minutes of Exercise per Session: Not on file  Stress:    Feeling of Stress : Not on file  Social Connections:    Frequency of Communication with Friends and Family: Not on file   Frequency of Social Gatherings with Friends and Family: Not on file   Attends Religious Services: Not on file   Active Member of Clubs or Organizations: Not on file   Attends Banker Meetings: Not on file   Marital Status: Not on file  Intimate Partner Violence:    Fear of Current or Ex-Partner: Not on file   Emotionally Abused: Not on file   Physically Abused: Not on file   Sexually Abused: Not on file    Allergies:  Allergies  Allergen Reactions   Kiwi Extract     Other reaction(s): Unknown   Other     Other reaction(s): Unknown DUST MITES   Pollen Extract     Other reaction(s): Unknown    Medications: Prior to Admission medications   Medication Sig Start Date End Date Taking? Authorizing Provider  diphenhydrAMINE (BENADRYL ALLERGY) 25 mg capsule Take 1 capsule (25 mg total) by mouth every 6 (six) hours as needed for itching or allergies. 11/19/19  Yes Miguel Aschoff., MD  Doxylamine-Pyridoxine (DICLEGIS) 10-10 MG TBEC Take 1 tablet by mouth in the morning and at bedtime. 04/27/20  Yes Gilles Chiquito, MD  Prenatal Vit-Fe Fumarate-FA (PRENATAL VITAMIN) 27-0.8 MG TABS Take 1 tablet by mouth daily. Patient taking differently: Take 1 tablet by mouth daily. Pt is taking the PNV Gummies 04/27/20  Yes Gilles Chiquito, MD  PROAIR HFA 108 [phone] Base) MCG/ACT inhaler Inhale 2 puffs into the lungs as needed. 06/03/20  Yes [provider]  promethazine (PHENERGAN) 12.5 MG tablet Take 1 tablet (12.5 mg total) by mouth every 6 (six) hours as needed for nausea or  vomiting. 05/01/20  Yes Jene Every, MD  triamcinolone cream (KENALOG) 0.1 % Apply 1 application topically 2 (two) times daily. 04/29/20  Yes [provider]  cetirizine (ZYRTEC ALLERGY) 10 MG tablet Take 1 tablet (10 mg total) by mouth daily for 14 days. 11/19/19 12/03/19  Miguel Aschoff., MD  famotidine (PEPCID) 20 MG tablet Take 1 tablet (20 mg total) by mouth 2 (two) times daily for 5 days. 05/17/19 09/24/19  Enid Derry, PA-C    Physical Exam Vitals: Blood pressure 110/70, weight 229 lb (103.9 kg).  General: NAD HEENT: normocephalic, anicteric Thyroid: no enlargement, no  palpable nodules Pulmonary: No increased work of breathing, CTAB Cardiovascular: RRR, distal pulses 2+ Abdomen: NABS, soft, non-tender, non-distended.  Umbilicus without lesions.  No hepatomegaly, splenomegaly or masses palpable. No evidence of hernia  Genitourinary:  External: Normal external female genitalia.  Normal urethral meatus, normal  Bartholin's and Skene's glands.    Vagina: Normal vaginal mucosa, no evidence of prolapse.    Cervix: Grossly normal in appearance, no bleeding  Uterus:  enlarged, mobile, normal contour.  No CMT  Adnexa: ovaries non-enlarged, no adnexal masses  Rectal: deferred Extremities: no edema, erythema, or tenderness Neurologic: Grossly intact Psychiatric: mood appropriate, affect full   Assessment: 19 y.o. G1P0 at [redacted]w[redacted]d presenting to initiate prenatal care  Plan: 1) Avoid alcoholic beverages. 2) Patient encouraged not to smoke.  3) Discontinue the use of all non-medicinal drugs and chemicals.  4) Take prenatal vitamins daily.  5) Nutrition, food safety (fish, cheese advisories, and high nitrite foods) and exercise discussed. 6) Hospital and practice style discussed with cross coverage system.  7) Genetic Screening, such as with 1st Trimester Screening, cell free fetal DNA, AFP testing, and Ultrasound, as well as with amniocentesis and CVS as appropriate, is discussed  with patient. At the conclusion of today's visit patient undecided genetic testing 8) Patient is asked about travel to areas at risk for the [place] virus, and counseled to avoid travel and exposure to mosquitoes or sexual partners who may have themselves been exposed to the virus. Testing is discussed, and will be ordered as appropriate.  She has follow up with MFM, who will perform another ultrasound. Pt with limited understanding of her baby's condition at this point. Social work assistance requested due to high risk and limited socia support. RTC in 4 weeks. NOB labs and Nuswab today. She was unable to urinate today. Mirna Mires, CNM  06/28/2020 11:21 AM

## 2020-06-28 NOTE — Progress Notes (Signed)
MFM Note  This patient contacted and appeared in our office this morning following her prenatal appointment at [facility].  She reported that the fetal heart rate could not be auscultated via the bedside Dopplers.  To check on the fetal status, I performed an ultrasound for fetal assessment.  The fetal heart rate varied between 67 bpm to 72 bpm.  Fetal movements were noted throughout today's exam.  There were no signs of fetal hydrops noted today.  A structural cardiac defect is suspected.  However, the views of the fetal heart were difficult to assess due to her body habitus and the fetal position.  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  The structural cardiac defect has most likely disrupted the conduction system of the fetal heart.  She has screened negative for SSA and SSB antibodies.  The increased risk of a fetal demise due to the low heart rate and suspected cardiac defect was discussed with the patient.  She was also advised that she would most likely have to deliver at another institution such as [facility] or [facility] so that her baby can get the appropriate subspecialist care after birth.  It is questionable if the patient understood everything that I discussed with her today.  She states that "everything is going to be okay with my baby".  The results of her cell free DNA test are currently pending.  She was advised that a congenital heart defect increases her chances of having a fetus with chromosomal abnormalities.  She was advised that definitive diagnosis of fetal aneuploidy can only be made via an amniocentesis.  She declined an amniocentesis today.  She is already scheduled to have a fetal echocardiogram with [facility] pediatric cardiology.  We will await their findings and recommendations.    She should have another ultrasound scheduled for fetal assessment in our office in  2 to 3 weeks.

## 2020-06-29 LAB — RPR+RH+ABO+RUB AB+AB SCR+CB...
Antibody Screen: NEGATIVE
HIV Screen 4th Generation wRfx: NONREACTIVE
Hematocrit: 36.7 % (ref 34.0–46.6)
Hemoglobin: 12.2 g/dL (ref 11.1–15.9)
Hepatitis B Surface Ag: NEGATIVE
MCH: 29.8 pg (ref 26.6–33.0)
MCHC: 33.2 g/dL (ref 31.5–35.7)
MCV: 90 fL (ref 79–97)
Platelets: 226 10*3/uL (ref 150–450)
RBC: 4.09 x10E6/uL (ref 3.77–5.28)
RDW: 12.6 % (ref 11.7–15.4)
RPR Ser Ql: NONREACTIVE
Rh Factor: POSITIVE
Rubella Antibodies, IGG: 5.05 index (ref >0.99)
Varicella zoster IgG: 266 index (ref >165)
WBC: 6.8 10*3/uL (ref 3.4–10.8)

## 2020-07-02 LAB — GC/CHLAMYDIA PROBE AMP
Chlamydia trachomatis, NAA: NEGATIVE
Neisseria Gonorrhoeae by PCR: NEGATIVE

## 2020-07-05 ENCOUNTER — Telehealth: Payer: Self-pay | Admitting: Obstetrics and Gynecology

## 2020-07-05 NOTE — Telephone Encounter (Signed)
Katrina Sherman was offered cell free fetal DNA testing in this pregnancy to assess the chance for a chromosome condition.  This was offered following the finding of likely fetal heart block with septal defect.    The patient was informed of the results of her recent MaterniT21 testing which yielded NEGATIVE results.  The patient's specimen showed DNA consistent with two copies of chromosomes 21, 18 and 13.  The sensitivity for trisomy 42, trisomy 98 and trisomy 30 using this testing are reported as 99.1%, 99.9% and 91.7% respectively.  Thus, while the results of this testing are highly accurate, they are not considered diagnostic at this time.  Should more definitive information be desired, the patient may still consider amniocentesis. It is important to remember that this testing cannot detect all chromosome differences or birth defects.  As requested to know by the patient, sex chromosome analysis was included for this sample.  Results are consistent with a female (XX)  fetus. This is predicted with >99% accuracy.  A maternal serum AFP only should be considered if screening for neural tube defects is desired.  We may be reached at [phone] with any questions or concerns.   [name], MS, CGC

## 2020-07-08 NOTE — Progress Notes (Signed)
Note created in error. No genetic counseling was provided. [name], MS, CGC

## 2020-07-09 ENCOUNTER — Encounter: Payer: Self-pay | Admitting: Obstetrics and Gynecology

## 2020-07-12 ENCOUNTER — Telehealth: Payer: Self-pay

## 2020-07-12 NOTE — Telephone Encounter (Signed)
Patient returning phone call and would like to be called back, not sure as to why or what the phone call was about.

## 2020-07-12 NOTE — Telephone Encounter (Signed)
Dr. Trudi Ida called after your nurse 07/09/20 5:38pm to discuss fetal cardiac issue.  PH was connected to caller 6:01pm

## 2020-07-12 NOTE — Telephone Encounter (Signed)
I called the patient. She did not answer. I will return the call between 5:30-6:30 PM. I left a message regarding this and when I called earlier this afternoon.

## 2020-07-12 NOTE — Telephone Encounter (Signed)
Patient is returning call. Patient aware to to keep and eye on her phone between 5:30 and 6.

## 2020-07-12 NOTE — Telephone Encounter (Signed)
Called and spoke with patient  I had an extended conversation with Katrina Sherman ([place]).  I spoke to her about the options for continuing her pregnancy care here in [place] versus in [place].  She reported that she would like to stay in [place] as much as possible for her prenatal care.  She understands that [facility] ultimately would not be involved in her delivery.  She did express that she would prefer to deliver at [facility].  She understands the need for weekly ultrasounds with MFM to monitor for signs of hydrops.

## 2020-07-12 NOTE — Telephone Encounter (Signed)
She was on call and did not know results of ECHO, but had been paged and was responding to call.  I did not have any additional information.  Awaiting results of ECHO, for MFM as they will assume care of patient based on cardiac concerns.

## 2020-07-15 ENCOUNTER — Ambulatory Visit: Payer: Medicaid Other

## 2020-07-15 ENCOUNTER — Telehealth: Payer: Self-pay | Admitting: Obstetrics and Gynecology

## 2020-07-15 NOTE — Telephone Encounter (Signed)
I spoke at length with Katrina Sherman about the upcoming appointments at [facility].  She was unhappy that she was not aware that they would be contacting her to schedule visits.  I explained that because of the recently diagnosed fetal heart defect, it will be important for her to deliver at [facility] (or [facility]).  These appointments upcoming will help to make sure she is in the system and they are prepared to take care of her baby at the time of birth. In the meanwhile, she desires to have as many appointments locally as possible to minimize travel issues.  I assured her that we will work with her doctors at [facility] as well as with [facility] to monitor locally whenever possible.  She plans to come to our clinic for an ultrasound for hydrops check on 07/22/20 at 4pm and will speak with Dr. Grace Bushy and myself that day about future plans.  [name], MS, CGC [facility] at [facility]

## 2020-07-19 ENCOUNTER — Ambulatory Visit: Payer: Medicaid Other

## 2020-07-19 ENCOUNTER — Other Ambulatory Visit: Payer: Self-pay | Admitting: Family Medicine

## 2020-07-19 DIAGNOSIS — O99212 Obesity complicating pregnancy, second trimester: Secondary | ICD-10-CM

## 2020-07-22 ENCOUNTER — Ambulatory Visit: Payer: Medicaid Other | Attending: Maternal & Fetal Medicine

## 2020-07-22 ENCOUNTER — Ambulatory Visit: Payer: Medicaid Other

## 2020-07-22 ENCOUNTER — Other Ambulatory Visit: Payer: Self-pay

## 2020-07-22 DIAGNOSIS — E669 Obesity, unspecified: Secondary | ICD-10-CM | POA: Insufficient documentation

## 2020-07-22 DIAGNOSIS — Z3A00 Weeks of gestation of pregnancy not specified: Secondary | ICD-10-CM | POA: Diagnosis not present

## 2020-07-22 DIAGNOSIS — O99212 Obesity complicating pregnancy, second trimester: Secondary | ICD-10-CM | POA: Insufficient documentation

## 2020-07-26 ENCOUNTER — Other Ambulatory Visit: Payer: Self-pay | Admitting: Family Medicine

## 2020-07-26 ENCOUNTER — Encounter: Payer: Medicaid Other | Admitting: Obstetrics and Gynecology

## 2020-07-26 DIAGNOSIS — O99212 Obesity complicating pregnancy, second trimester: Secondary | ICD-10-CM

## 2020-07-28 ENCOUNTER — Encounter: Payer: Medicaid Other | Admitting: Obstetrics and Gynecology

## 2020-07-29 ENCOUNTER — Other Ambulatory Visit: Payer: Self-pay | Admitting: Family Medicine

## 2020-07-29 ENCOUNTER — Other Ambulatory Visit: Payer: Medicaid Other

## 2020-07-29 DIAGNOSIS — Q249 Congenital malformation of heart, unspecified: Secondary | ICD-10-CM

## 2020-07-29 DIAGNOSIS — O99212 Obesity complicating pregnancy, second trimester: Secondary | ICD-10-CM

## 2020-07-29 DIAGNOSIS — O09892 Supervision of other high risk pregnancies, second trimester: Secondary | ICD-10-CM

## 2020-08-03 ENCOUNTER — Other Ambulatory Visit: Payer: Self-pay

## 2020-08-03 ENCOUNTER — Ambulatory Visit: Payer: Medicaid Other | Attending: Obstetrics

## 2020-08-03 DIAGNOSIS — O99212 Obesity complicating pregnancy, second trimester: Secondary | ICD-10-CM | POA: Diagnosis not present

## 2020-08-03 DIAGNOSIS — O0990 Supervision of high risk pregnancy, unspecified, unspecified trimester: Secondary | ICD-10-CM

## 2020-08-03 DIAGNOSIS — O09892 Supervision of other high risk pregnancies, second trimester: Secondary | ICD-10-CM

## 2020-08-03 DIAGNOSIS — Z3A23 23 weeks gestation of pregnancy: Secondary | ICD-10-CM

## 2020-08-03 DIAGNOSIS — O359XX0 Maternal care for (suspected) fetal abnormality and damage, unspecified, not applicable or unspecified: Secondary | ICD-10-CM | POA: Diagnosis not present

## 2020-08-03 DIAGNOSIS — Q249 Congenital malformation of heart, unspecified: Secondary | ICD-10-CM

## 2020-08-09 ENCOUNTER — Other Ambulatory Visit: Payer: Medicaid Other

## 2020-08-12 ENCOUNTER — Other Ambulatory Visit: Payer: Self-pay | Admitting: Family Medicine

## 2020-08-12 DIAGNOSIS — Q249 Congenital malformation of heart, unspecified: Secondary | ICD-10-CM

## 2020-08-12 DIAGNOSIS — O99212 Obesity complicating pregnancy, second trimester: Secondary | ICD-10-CM

## 2020-08-16 ENCOUNTER — Ambulatory Visit (INDEPENDENT_AMBULATORY_CARE_PROVIDER_SITE_OTHER): Payer: Medicaid Other | Admitting: Obstetrics and Gynecology

## 2020-08-16 ENCOUNTER — Other Ambulatory Visit: Payer: Self-pay | Admitting: Family Medicine

## 2020-08-16 ENCOUNTER — Encounter: Payer: Self-pay | Admitting: Obstetrics and Gynecology

## 2020-08-16 ENCOUNTER — Ambulatory Visit: Payer: Medicaid Other | Attending: Maternal & Fetal Medicine

## 2020-08-16 ENCOUNTER — Other Ambulatory Visit: Payer: Self-pay

## 2020-08-16 VITALS — BP 114/72 | Ht 64.0 in

## 2020-08-16 DIAGNOSIS — Z3A25 25 weeks gestation of pregnancy: Secondary | ICD-10-CM | POA: Diagnosis not present

## 2020-08-16 DIAGNOSIS — O368320 Maternal care for abnormalities of the fetal heart rate or rhythm, second trimester, not applicable or unspecified: Secondary | ICD-10-CM | POA: Insufficient documentation

## 2020-08-16 DIAGNOSIS — E669 Obesity, unspecified: Secondary | ICD-10-CM | POA: Insufficient documentation

## 2020-08-16 DIAGNOSIS — O358XX0 Maternal care for other (suspected) fetal abnormality and damage, not applicable or unspecified: Secondary | ICD-10-CM | POA: Diagnosis not present

## 2020-08-16 DIAGNOSIS — O99212 Obesity complicating pregnancy, second trimester: Secondary | ICD-10-CM

## 2020-08-16 DIAGNOSIS — O3622X0 Maternal care for hydrops fetalis, second trimester, not applicable or unspecified: Secondary | ICD-10-CM | POA: Diagnosis not present

## 2020-08-16 DIAGNOSIS — Q249 Congenital malformation of heart, unspecified: Secondary | ICD-10-CM

## 2020-08-16 DIAGNOSIS — O09892 Supervision of other high risk pregnancies, second trimester: Secondary | ICD-10-CM

## 2020-08-16 DIAGNOSIS — O0990 Supervision of high risk pregnancy, unspecified, unspecified trimester: Secondary | ICD-10-CM

## 2020-08-16 DIAGNOSIS — O365920 Maternal care for other known or suspected poor fetal growth, second trimester, not applicable or unspecified: Secondary | ICD-10-CM

## 2020-08-16 DIAGNOSIS — Z130 Encounter for screening for diseases of the blood and blood-forming organs and certain disorders involving the immune mechanism: Secondary | ICD-10-CM

## 2020-08-16 NOTE — Progress Notes (Signed)
Routine Prenatal Care Visit  Subjective  Katrina Sherman is a 19 y.o. G1P0 at [redacted]w[redacted]d being seen today for ongoing prenatal care.  She is currently monitored for the following issues for this high-risk pregnancy and has Pregnancy; Supervision of high risk pregnancy, antepartum; and Encounter for supervision of high risk pregnancy due to fetal anomaly, second trimester on their problem list.  ----------------------------------------------------------------------------------- Patient reports no complaints.   Contractions: Not present. Vag. Bleeding: None.  Movement: Present. Denies leaking of fluid.  ----------------------------------------------------------------------------------- The following portions of the patient's history were reviewed and updated as appropriate: allergies, current medications, past family history, past medical history, past social history, past surgical history and problem list. Problem list updated.   Objective  Blood pressure 114/72, height 5\' 4"  (1.626 m). Pregravid weight Pregravid weight not on file Total Weight Gain Not found. Urinalysis:      Fetal Status: Fetal Heart Rate (bpm): 70 Fundal Height: 34 cm Movement: Present     General:  Alert, oriented and cooperative. Patient is in no acute distress.  Skin: Skin is warm and dry. No rash noted.   Cardiovascular: Normal heart rate noted  Respiratory: Normal respiratory effort, no problems with respiration noted  Abdomen: Soft, gravid, appropriate for gestational age. Pain/Pressure: Absent     Pelvic:  Cervical exam deferred        Extremities: Normal range of motion.  Edema: None  Mental Status: Normal mood and affect. Normal behavior. Normal judgment and thought content.     Assessment   19 y.o. G1P0 at [redacted]w[redacted]d by  11/25/2020, Alternate EDD Entry presenting for routine prenatal visit  Plan   G1 Problems (from 05/01/20 to present)    Problem Noted Resolved   Supervision of high risk pregnancy, antepartum  06/28/2020 by 06/30/2020, CNM No   Overview Addendum 08/16/2020  7:57 PM by 14/02/2020, MD      Nursing Staff Provider  Office Location  [facility] Dating   9 wk Katrina Sherman  Language  English Anatomy US   Heart malformation  Flu Vaccine  Declines Genetic Screen  NIPS:  Normal XX   TDaP vaccine    Hgb A1C or  GTT Early :none Third trimester :   Rhogam  Not needed   LAB RESULTS   Feeding Plan breast Blood Type B/Positive/-- (10/18 1115)   Contraception Discussed- desires none Antibody Negative (10/18 1115)  Circumcision  not applicable Rubella 5.05 (10/18 1115)  Pediatrician   RPR Non Reactive (10/18 1115)   Support Person  HBsAg Negative (10/18 1115)   Prenatal Classes  HIV Non Reactive (10/18 1115)    Varicella Immune  BTL Consent  GBS          VBAC Consent  Pap  Under 21    Hgb Electro      CF      SMA                 Previous Version   Encounter for supervision of high risk pregnancy due to fetal anomaly, second trimester 06/28/2020 by 06/30/2020, CNM No   Overview Signed 06/28/2020 11:26 AM by 06/30/2020, CNM    Fetal heart block Septal defect. See [name] notes        Reports that she has a visit with [facility] planned for later this month to plan for her baby's delivery 1hr GTT next visit Declines flu vaccination Discussed TDaP vaccination during pregnancy Discussed breast feeding, lactation classes, encouraged patient  to learn about pumping especially since infant will likely be in NICU after birth.   Gestational age appropriate obstetric precautions including but not limited to vaginal bleeding, contractions, leaking of fluid and fetal movement were reviewed in detail with the patient.    Return in about 2 weeks (around 08/30/2020) for ROB and 1 GTT.  Katrina Milch MD [facility], [facility] 08/16/2020, 7:52 PM

## 2020-08-16 NOTE — Patient Instructions (Signed)

## 2020-08-23 ENCOUNTER — Other Ambulatory Visit: Payer: Self-pay | Admitting: Family Medicine

## 2020-08-23 DIAGNOSIS — O99210 Obesity complicating pregnancy, unspecified trimester: Secondary | ICD-10-CM

## 2020-08-23 DIAGNOSIS — O368390 Maternal care for abnormalities of the fetal heart rate or rhythm, unspecified trimester, not applicable or unspecified: Secondary | ICD-10-CM

## 2020-08-23 DIAGNOSIS — O365920 Maternal care for other known or suspected poor fetal growth, second trimester, not applicable or unspecified: Secondary | ICD-10-CM

## 2020-08-26 ENCOUNTER — Ambulatory Visit: Payer: Medicaid Other | Attending: Obstetrics

## 2020-08-26 ENCOUNTER — Other Ambulatory Visit: Payer: Self-pay

## 2020-08-26 ENCOUNTER — Other Ambulatory Visit: Payer: Self-pay | Admitting: Family Medicine

## 2020-08-26 DIAGNOSIS — O365920 Maternal care for other known or suspected poor fetal growth, second trimester, not applicable or unspecified: Secondary | ICD-10-CM | POA: Diagnosis not present

## 2020-08-26 DIAGNOSIS — O0990 Supervision of high risk pregnancy, unspecified, unspecified trimester: Secondary | ICD-10-CM

## 2020-08-26 DIAGNOSIS — Z3A27 27 weeks gestation of pregnancy: Secondary | ICD-10-CM | POA: Insufficient documentation

## 2020-08-26 DIAGNOSIS — O99210 Obesity complicating pregnancy, unspecified trimester: Secondary | ICD-10-CM

## 2020-08-26 DIAGNOSIS — O368320 Maternal care for abnormalities of the fetal heart rate or rhythm, second trimester, not applicable or unspecified: Secondary | ICD-10-CM | POA: Insufficient documentation

## 2020-08-26 DIAGNOSIS — O99212 Obesity complicating pregnancy, second trimester: Secondary | ICD-10-CM

## 2020-08-26 DIAGNOSIS — O09892 Supervision of other high risk pregnancies, second trimester: Secondary | ICD-10-CM

## 2020-08-26 DIAGNOSIS — O368390 Maternal care for abnormalities of the fetal heart rate or rhythm, unspecified trimester, not applicable or unspecified: Secondary | ICD-10-CM

## 2020-08-26 DIAGNOSIS — E669 Obesity, unspecified: Secondary | ICD-10-CM | POA: Diagnosis not present

## 2020-08-30 ENCOUNTER — Ambulatory Visit: Payer: Medicaid Other | Attending: Obstetrics and Gynecology

## 2020-08-30 ENCOUNTER — Other Ambulatory Visit: Payer: Self-pay

## 2020-08-30 ENCOUNTER — Other Ambulatory Visit: Payer: Medicaid Other

## 2020-08-30 DIAGNOSIS — O368390 Maternal care for abnormalities of the fetal heart rate or rhythm, unspecified trimester, not applicable or unspecified: Secondary | ICD-10-CM

## 2020-08-30 DIAGNOSIS — Z3A27 27 weeks gestation of pregnancy: Secondary | ICD-10-CM | POA: Diagnosis not present

## 2020-08-30 DIAGNOSIS — O368330 Maternal care for abnormalities of the fetal heart rate or rhythm, third trimester, not applicable or unspecified: Secondary | ICD-10-CM

## 2020-08-30 DIAGNOSIS — O365930 Maternal care for other known or suspected poor fetal growth, third trimester, not applicable or unspecified: Secondary | ICD-10-CM | POA: Diagnosis not present

## 2020-08-30 DIAGNOSIS — O368320 Maternal care for abnormalities of the fetal heart rate or rhythm, second trimester, not applicable or unspecified: Secondary | ICD-10-CM | POA: Diagnosis not present

## 2020-08-30 DIAGNOSIS — O09892 Supervision of other high risk pregnancies, second trimester: Secondary | ICD-10-CM | POA: Diagnosis not present

## 2020-08-30 DIAGNOSIS — O0990 Supervision of high risk pregnancy, unspecified, unspecified trimester: Secondary | ICD-10-CM

## 2020-08-30 DIAGNOSIS — O99212 Obesity complicating pregnancy, second trimester: Secondary | ICD-10-CM

## 2020-09-02 ENCOUNTER — Other Ambulatory Visit: Payer: Self-pay | Admitting: Family Medicine

## 2020-09-02 ENCOUNTER — Ambulatory Visit (INDEPENDENT_AMBULATORY_CARE_PROVIDER_SITE_OTHER): Payer: Medicaid Other | Admitting: Obstetrics and Gynecology

## 2020-09-02 ENCOUNTER — Other Ambulatory Visit: Payer: Self-pay

## 2020-09-02 ENCOUNTER — Telehealth: Payer: Self-pay | Admitting: Obstetrics and Gynecology

## 2020-09-02 ENCOUNTER — Other Ambulatory Visit: Payer: Medicaid Other

## 2020-09-02 DIAGNOSIS — O99213 Obesity complicating pregnancy, third trimester: Secondary | ICD-10-CM

## 2020-09-02 DIAGNOSIS — O0990 Supervision of high risk pregnancy, unspecified, unspecified trimester: Secondary | ICD-10-CM

## 2020-09-02 DIAGNOSIS — O09893 Supervision of other high risk pregnancies, third trimester: Secondary | ICD-10-CM

## 2020-09-02 DIAGNOSIS — O35BXX0 Maternal care for other (suspected) fetal abnormality and damage, fetal cardiac anomalies, not applicable or unspecified: Secondary | ICD-10-CM

## 2020-09-02 DIAGNOSIS — O368390 Maternal care for abnormalities of the fetal heart rate or rhythm, unspecified trimester, not applicable or unspecified: Secondary | ICD-10-CM

## 2020-09-02 NOTE — Telephone Encounter (Signed)
Patient was having trouble with medicaid transportation pt call office to inform us she would be late she spoke with [place] , Pt showed up about 45 mins past sch apt time for 1 hr gtt and about 25 mins past ROB visit. I spoke with jennifer who said pt will have to rsch 1hr gtt and then spoke with yourself who okay' d to still see pt. Also to have pt sch a week or two out to come back for missed glucose test today. I also explained to pt that you would still see her today. I checked pt in and pt stated I didn't know I was seeing her today, all she does is talk , Pt signed AOB and shortly after pt left lobby and refused to sch for the 1hr gtt . I called pt and told pt you wanted to see pt today and pt stated she will not sch anymore appts and that "it is okay"

## 2020-09-06 ENCOUNTER — Other Ambulatory Visit: Payer: Medicaid Other

## 2020-09-06 NOTE — Progress Notes (Deleted)
Pt had [facility] appt @ 1600 today.  Pt called @ 1543 to cancel.  She agreed to keep her next appt on 09/15/19 with Dr. Parke Poisson.

## 2020-09-07 NOTE — Progress Notes (Signed)
Patient checked in an hour late for appointment. She then left when told she was too late to complete her 1 hour testing.   Katrina Idler MD, [facility], [facility] 09/07/2020 2:07 PM

## 2020-09-14 ENCOUNTER — Other Ambulatory Visit: Payer: Self-pay

## 2020-09-14 ENCOUNTER — Ambulatory Visit: Payer: Medicaid Other | Attending: Obstetrics

## 2020-09-14 DIAGNOSIS — O09892 Supervision of other high risk pregnancies, second trimester: Secondary | ICD-10-CM

## 2020-09-14 DIAGNOSIS — O358XX0 Maternal care for other (suspected) fetal abnormality and damage, not applicable or unspecified: Secondary | ICD-10-CM

## 2020-09-14 DIAGNOSIS — O99213 Obesity complicating pregnancy, third trimester: Secondary | ICD-10-CM | POA: Diagnosis not present

## 2020-09-14 DIAGNOSIS — O368390 Maternal care for abnormalities of the fetal heart rate or rhythm, unspecified trimester, not applicable or unspecified: Secondary | ICD-10-CM | POA: Diagnosis not present

## 2020-09-14 DIAGNOSIS — E669 Obesity, unspecified: Secondary | ICD-10-CM | POA: Insufficient documentation

## 2020-09-14 DIAGNOSIS — O365930 Maternal care for other known or suspected poor fetal growth, third trimester, not applicable or unspecified: Secondary | ICD-10-CM | POA: Diagnosis present

## 2020-09-14 DIAGNOSIS — Z3A29 29 weeks gestation of pregnancy: Secondary | ICD-10-CM

## 2020-09-14 DIAGNOSIS — O09893 Supervision of other high risk pregnancies, third trimester: Secondary | ICD-10-CM

## 2020-09-14 DIAGNOSIS — O35BXX0 Maternal care for other (suspected) fetal abnormality and damage, fetal cardiac anomalies, not applicable or unspecified: Secondary | ICD-10-CM

## 2020-09-14 DIAGNOSIS — O0990 Supervision of high risk pregnancy, unspecified, unspecified trimester: Secondary | ICD-10-CM

## 2020-09-16 ENCOUNTER — Other Ambulatory Visit: Payer: Self-pay | Admitting: Family Medicine

## 2020-09-16 DIAGNOSIS — O368390 Maternal care for abnormalities of the fetal heart rate or rhythm, unspecified trimester, not applicable or unspecified: Secondary | ICD-10-CM

## 2020-09-16 DIAGNOSIS — O99210 Obesity complicating pregnancy, unspecified trimester: Secondary | ICD-10-CM

## 2020-09-16 DIAGNOSIS — O09893 Supervision of other high risk pregnancies, third trimester: Secondary | ICD-10-CM

## 2020-09-16 DIAGNOSIS — O365910 Maternal care for other known or suspected poor fetal growth, first trimester, not applicable or unspecified: Secondary | ICD-10-CM

## 2020-09-21 ENCOUNTER — Ambulatory Visit: Payer: Medicaid Other

## 2020-09-21 NOTE — Progress Notes (Deleted)
Pt had a scheduled appt today at [facility] today @ 3pm.  We had an opening in our schedule for earlier in the day.  Pt agreed to change times and come in at 9:00am.  Pt was a "No Show" for that time slot.  Pt then called the office at 1349 to say that she just had received + Covid test results back. Instructed pt to follow CDC Guidelines for Covid. Dr. Jerene Pitch @ [facility] notified of patient + Covid Result that does not appear in Epic.

## 2020-09-30 ENCOUNTER — Ambulatory Visit: Payer: Medicaid Other

## 2020-10-06 NOTE — Telephone Encounter (Signed)
Sheralyn Boatman from [facility] call about patient. Patient had an appointment with them today apparently walked out of the office. Sheralyn Boatman contacted [place] to see if we would reach out to patient. I called and spoke with patient about scheduling a follow up appointment. The patient stated she was no longer a patient with [place] and she need to go have a glucose test done. I tried to see if we can schedule the appointment for the patient and the patient stated again she didn't want to scheduled with [place].

## 2020-10-19 ENCOUNTER — Other Ambulatory Visit: Payer: Self-pay | Admitting: Family Medicine

## 2020-10-19 DIAGNOSIS — E669 Obesity, unspecified: Secondary | ICD-10-CM

## 2020-10-21 ENCOUNTER — Ambulatory Visit: Payer: Medicaid Other | Attending: Maternal & Fetal Medicine

## 2020-10-21 ENCOUNTER — Other Ambulatory Visit: Payer: Self-pay

## 2020-10-21 ENCOUNTER — Other Ambulatory Visit: Payer: Self-pay | Admitting: Family Medicine

## 2020-10-21 DIAGNOSIS — O365930 Maternal care for other known or suspected poor fetal growth, third trimester, not applicable or unspecified: Secondary | ICD-10-CM | POA: Diagnosis not present

## 2020-10-21 DIAGNOSIS — E669 Obesity, unspecified: Secondary | ICD-10-CM

## 2020-10-21 DIAGNOSIS — O0990 Supervision of high risk pregnancy, unspecified, unspecified trimester: Secondary | ICD-10-CM

## 2020-10-21 DIAGNOSIS — O368330 Maternal care for abnormalities of the fetal heart rate or rhythm, third trimester, not applicable or unspecified: Secondary | ICD-10-CM | POA: Diagnosis not present

## 2020-10-21 DIAGNOSIS — O99213 Obesity complicating pregnancy, third trimester: Secondary | ICD-10-CM | POA: Diagnosis present

## 2020-10-21 DIAGNOSIS — O09892 Supervision of other high risk pregnancies, second trimester: Secondary | ICD-10-CM

## 2020-10-21 DIAGNOSIS — O358XX0 Maternal care for other (suspected) fetal abnormality and damage, not applicable or unspecified: Secondary | ICD-10-CM | POA: Diagnosis not present

## 2020-10-21 DIAGNOSIS — Z3A35 35 weeks gestation of pregnancy: Secondary | ICD-10-CM | POA: Insufficient documentation

## 2020-10-26 ENCOUNTER — Other Ambulatory Visit: Payer: Self-pay | Admitting: Family Medicine

## 2020-10-26 DIAGNOSIS — O99210 Obesity complicating pregnancy, unspecified trimester: Secondary | ICD-10-CM

## 2020-10-26 DIAGNOSIS — O09893 Supervision of other high risk pregnancies, third trimester: Secondary | ICD-10-CM

## 2020-10-26 DIAGNOSIS — Q249 Congenital malformation of heart, unspecified: Secondary | ICD-10-CM

## 2020-10-28 ENCOUNTER — Ambulatory Visit: Payer: Medicaid Other | Attending: Maternal & Fetal Medicine

## 2020-10-28 ENCOUNTER — Other Ambulatory Visit: Payer: Self-pay

## 2020-10-28 DIAGNOSIS — O365930 Maternal care for other known or suspected poor fetal growth, third trimester, not applicable or unspecified: Secondary | ICD-10-CM | POA: Diagnosis present

## 2020-10-28 DIAGNOSIS — O99210 Obesity complicating pregnancy, unspecified trimester: Secondary | ICD-10-CM

## 2020-10-28 DIAGNOSIS — O368330 Maternal care for abnormalities of the fetal heart rate or rhythm, third trimester, not applicable or unspecified: Secondary | ICD-10-CM | POA: Insufficient documentation

## 2020-10-28 DIAGNOSIS — O0990 Supervision of high risk pregnancy, unspecified, unspecified trimester: Secondary | ICD-10-CM

## 2020-10-28 DIAGNOSIS — O09892 Supervision of other high risk pregnancies, second trimester: Secondary | ICD-10-CM

## 2020-10-28 DIAGNOSIS — Z3A36 36 weeks gestation of pregnancy: Secondary | ICD-10-CM

## 2020-10-28 DIAGNOSIS — Q249 Congenital malformation of heart, unspecified: Secondary | ICD-10-CM

## 2020-10-28 DIAGNOSIS — E669 Obesity, unspecified: Secondary | ICD-10-CM | POA: Insufficient documentation

## 2020-10-28 DIAGNOSIS — O99213 Obesity complicating pregnancy, third trimester: Secondary | ICD-10-CM | POA: Diagnosis not present

## 2020-10-28 DIAGNOSIS — O09893 Supervision of other high risk pregnancies, third trimester: Secondary | ICD-10-CM

## 2020-10-28 NOTE — Patient Instructions (Signed)
Pt was attended her Maternal Fetal Medicine appt today at [facility] 10/29/19.  She was cleared by Dr. Grace Bushy to return to work. [phone]

## 2020-11-02 ENCOUNTER — Other Ambulatory Visit: Payer: Self-pay | Admitting: Family Medicine

## 2020-11-02 DIAGNOSIS — O99210 Obesity complicating pregnancy, unspecified trimester: Secondary | ICD-10-CM

## 2020-11-02 DIAGNOSIS — O09893 Supervision of other high risk pregnancies, third trimester: Secondary | ICD-10-CM

## 2020-11-02 DIAGNOSIS — O365910 Maternal care for other known or suspected poor fetal growth, first trimester, not applicable or unspecified: Secondary | ICD-10-CM

## 2020-11-02 DIAGNOSIS — O368390 Maternal care for abnormalities of the fetal heart rate or rhythm, unspecified trimester, not applicable or unspecified: Secondary | ICD-10-CM

## 2020-11-04 ENCOUNTER — Other Ambulatory Visit: Payer: Self-pay

## 2020-11-04 ENCOUNTER — Ambulatory Visit: Payer: Medicaid Other | Attending: Maternal & Fetal Medicine

## 2020-11-04 ENCOUNTER — Ambulatory Visit: Payer: Medicaid Other

## 2020-11-04 DIAGNOSIS — Z3A37 37 weeks gestation of pregnancy: Secondary | ICD-10-CM | POA: Diagnosis not present

## 2020-11-04 DIAGNOSIS — O0990 Supervision of high risk pregnancy, unspecified, unspecified trimester: Secondary | ICD-10-CM

## 2020-11-04 DIAGNOSIS — O365910 Maternal care for other known or suspected poor fetal growth, first trimester, not applicable or unspecified: Secondary | ICD-10-CM | POA: Diagnosis not present

## 2020-11-04 DIAGNOSIS — O99210 Obesity complicating pregnancy, unspecified trimester: Secondary | ICD-10-CM

## 2020-11-04 DIAGNOSIS — O365930 Maternal care for other known or suspected poor fetal growth, third trimester, not applicable or unspecified: Secondary | ICD-10-CM | POA: Diagnosis not present

## 2020-11-04 DIAGNOSIS — O99213 Obesity complicating pregnancy, third trimester: Secondary | ICD-10-CM | POA: Insufficient documentation

## 2020-11-04 DIAGNOSIS — O09892 Supervision of other high risk pregnancies, second trimester: Secondary | ICD-10-CM

## 2020-11-04 DIAGNOSIS — O368390 Maternal care for abnormalities of the fetal heart rate or rhythm, unspecified trimester, not applicable or unspecified: Secondary | ICD-10-CM | POA: Insufficient documentation

## 2020-11-04 DIAGNOSIS — O09893 Supervision of other high risk pregnancies, third trimester: Secondary | ICD-10-CM

## 2020-11-09 ENCOUNTER — Other Ambulatory Visit: Payer: Self-pay | Admitting: Family Medicine

## 2020-11-09 DIAGNOSIS — O99213 Obesity complicating pregnancy, third trimester: Secondary | ICD-10-CM

## 2020-11-09 DIAGNOSIS — O09893 Supervision of other high risk pregnancies, third trimester: Secondary | ICD-10-CM

## 2020-11-09 DIAGNOSIS — O368390 Maternal care for abnormalities of the fetal heart rate or rhythm, unspecified trimester, not applicable or unspecified: Secondary | ICD-10-CM

## 2020-11-09 DIAGNOSIS — O365931 Maternal care for other known or suspected poor fetal growth, third trimester, fetus 1: Secondary | ICD-10-CM

## 2020-11-11 ENCOUNTER — Ambulatory Visit: Payer: Medicaid Other | Attending: Obstetrics

## 2020-11-11 ENCOUNTER — Other Ambulatory Visit: Payer: Self-pay

## 2020-11-11 ENCOUNTER — Ambulatory Visit (HOSPITAL_BASED_OUTPATIENT_CLINIC_OR_DEPARTMENT_OTHER): Payer: Medicaid Other

## 2020-11-11 DIAGNOSIS — Z3A38 38 weeks gestation of pregnancy: Secondary | ICD-10-CM | POA: Insufficient documentation

## 2020-11-11 DIAGNOSIS — O09893 Supervision of other high risk pregnancies, third trimester: Secondary | ICD-10-CM

## 2020-11-11 DIAGNOSIS — O368330 Maternal care for abnormalities of the fetal heart rate or rhythm, third trimester, not applicable or unspecified: Secondary | ICD-10-CM | POA: Insufficient documentation

## 2020-11-11 DIAGNOSIS — O99213 Obesity complicating pregnancy, third trimester: Secondary | ICD-10-CM

## 2020-11-11 DIAGNOSIS — O368390 Maternal care for abnormalities of the fetal heart rate or rhythm, unspecified trimester, not applicable or unspecified: Secondary | ICD-10-CM | POA: Diagnosis not present

## 2020-11-11 DIAGNOSIS — O365930 Maternal care for other known or suspected poor fetal growth, third trimester, not applicable or unspecified: Secondary | ICD-10-CM | POA: Insufficient documentation

## 2020-11-11 DIAGNOSIS — O365931 Maternal care for other known or suspected poor fetal growth, third trimester, fetus 1: Secondary | ICD-10-CM

## 2020-11-11 DIAGNOSIS — O3403 Maternal care for unspecified congenital malformation of uterus, third trimester: Secondary | ICD-10-CM

## 2020-11-11 NOTE — Patient Instructions (Signed)
Katrina Sherman was at [facility]etal Care @ [facility] today for an appointment. Please excuse her today from work but she is able to return to work.She is scheduled for delivery on March 10th, 2022.

## 2020-12-27 ENCOUNTER — Other Ambulatory Visit: Payer: Self-pay

## 2020-12-27 ENCOUNTER — Emergency Department
Admission: EM | Admit: 2020-12-27 | Discharge: 2020-12-28 | Disposition: A | Discharge status: Home / Self Care | Payer: Medicaid Other | Attending: Emergency Medicine | Admitting: Emergency Medicine

## 2020-12-27 ENCOUNTER — Emergency Department: Payer: Medicaid Other

## 2020-12-27 DIAGNOSIS — M542 Cervicalgia: Secondary | ICD-10-CM | POA: Diagnosis not present

## 2020-12-27 DIAGNOSIS — R0781 Pleurodynia: Secondary | ICD-10-CM | POA: Insufficient documentation

## 2020-12-27 DIAGNOSIS — Y92410 Unspecified street and highway as the place of occurrence of the external cause: Secondary | ICD-10-CM | POA: Diagnosis not present

## 2020-12-27 DIAGNOSIS — M79604 Pain in right leg: Secondary | ICD-10-CM | POA: Insufficient documentation

## 2020-12-27 DIAGNOSIS — J45909 Unspecified asthma, uncomplicated: Secondary | ICD-10-CM | POA: Insufficient documentation

## 2020-12-27 DIAGNOSIS — R32 Unspecified urinary incontinence: Secondary | ICD-10-CM | POA: Diagnosis not present

## 2020-12-27 DIAGNOSIS — S3991XA Unspecified injury of abdomen, initial encounter: Secondary | ICD-10-CM | POA: Insufficient documentation

## 2020-12-27 DIAGNOSIS — I10 Essential (primary) hypertension: Secondary | ICD-10-CM | POA: Diagnosis not present

## 2020-12-27 DIAGNOSIS — M549 Dorsalgia, unspecified: Secondary | ICD-10-CM | POA: Diagnosis not present

## 2020-12-27 DIAGNOSIS — R109 Unspecified abdominal pain: Secondary | ICD-10-CM

## 2020-12-27 DIAGNOSIS — M25561 Pain in right knee: Secondary | ICD-10-CM

## 2020-12-27 DIAGNOSIS — R079 Chest pain, unspecified: Secondary | ICD-10-CM

## 2020-12-27 LAB — CBC WITH DIFFERENTIAL/PLATELET
Abs Immature Granulocytes: 0.02 10*3/uL (ref 0.00–0.07)
Basophils Absolute: 0 10*3/uL (ref 0.0–0.1)
Basophils Relative: 1 %
Eosinophils Absolute: 0.1 10*3/uL (ref 0.0–0.5)
Eosinophils Relative: 2 %
HCT: 34.5 % — ABNORMAL LOW (ref 36.0–46.0)
Hemoglobin: 11.2 g/dL — ABNORMAL LOW (ref 12.0–15.0)
Immature Granulocytes: 0 %
Lymphocytes Relative: 44 %
Lymphs Abs: 2.9 10*3/uL (ref 0.7–4.0)
MCH: 27.5 pg (ref 26.0–34.0)
MCHC: 32.5 g/dL (ref 30.0–36.0)
MCV: 84.6 fL (ref 80.0–100.0)
Monocytes Absolute: 0.4 10*3/uL (ref 0.1–1.0)
Monocytes Relative: 6 %
Neutro Abs: 3.2 10*3/uL (ref 1.7–7.7)
Neutrophils Relative %: 47 %
Platelets: 251 10*3/uL (ref 150–400)
RBC: 4.08 MIL/uL (ref 3.87–5.11)
RDW: 13.4 % (ref 11.5–15.5)
WBC: 6.7 10*3/uL (ref 4.0–10.5)
nRBC: 0 % (ref 0.0–0.2)

## 2020-12-27 LAB — COMPREHENSIVE METABOLIC PANEL
ALT: 24 U/L (ref 0–44)
AST: 20 U/L (ref 15–41)
Albumin: 3.5 g/dL (ref 3.5–5.0)
Alkaline Phosphatase: 30 U/L — ABNORMAL LOW (ref 38–126)
Anion gap: 9 (ref 5–15)
BUN: 15 mg/dL (ref 6–20)
CO2: 21 mmol/L — ABNORMAL LOW (ref 22–32)
Calcium: 8.7 mg/dL — ABNORMAL LOW (ref 8.9–10.3)
Chloride: 109 mmol/L (ref 98–111)
Creatinine, Ser: 0.86 mg/dL (ref 0.44–1.00)
GFR, Estimated: >60 mL/min (ref >60)
Glucose, Bld: 94 mg/dL (ref 70–99)
Potassium: 3.7 mmol/L (ref 3.5–5.1)
Sodium: 139 mmol/L (ref 135–145)
Total Bilirubin: 0.6 mg/dL (ref 0.3–1.2)
Total Protein: 6.3 g/dL — ABNORMAL LOW (ref 6.5–8.1)

## 2020-12-27 MED ORDER — IOHEXOL 300 MG/ML  SOLN
100.0000 mL | Freq: Once | INTRAMUSCULAR | Status: AC | PRN
  Administered 2020-12-27: 100 mL via INTRAVENOUS
  Filled 2020-12-27: qty 100

## 2020-12-27 MED ORDER — MORPHINE SULFATE (PF) 4 MG/ML IV SOLN
4.0000 mg | Freq: Once | INTRAVENOUS | Status: AC
  Administered 2020-12-27: 4 mg via INTRAVENOUS
  Filled 2020-12-27: qty 1

## 2020-12-27 NOTE — ED Notes (Signed)
Patient assisted from wheelchair to bed, patient reports she urinated on herself in her brief, patient reports she has had incontinence since having baby 1 month ago.
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
## 2020-12-27 NOTE — ED Notes (Signed)
Pt back from CT at this time 

## 2020-12-27 NOTE — ED Notes (Signed)
Patient ambulated to doorway and stated she urinated on floor.
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
## 2020-12-27 NOTE — ED Notes (Signed)
Pt to CT at this time.

## 2020-12-27 NOTE — ED Notes (Signed)
Xr at bedside

## 2020-12-28 ENCOUNTER — Emergency Department: Payer: Medicaid Other

## 2020-12-28 LAB — URINALYSIS, COMPLETE (UACMP) WITH MICROSCOPIC
Bacteria, UA: NONE SEEN
Bilirubin Urine: NEGATIVE
Glucose, UA: NEGATIVE mg/dL
Ketones, ur: NEGATIVE mg/dL
Leukocytes,Ua: NEGATIVE
Nitrite: NEGATIVE
Protein, ur: NEGATIVE mg/dL
Specific Gravity, Urine: >1.046 — ABNORMAL HIGH (ref 1.005–1.030)
pH: 5 (ref 5.0–8.0)

## 2020-12-28 LAB — PREGNANCY, URINE: Preg Test, Ur: NEGATIVE

## 2020-12-28 MED ORDER — KETOROLAC TROMETHAMINE 30 MG/ML IJ SOLN
30.0000 mg | Freq: Once | INTRAMUSCULAR | Status: AC
  Administered 2020-12-28: 30 mg via INTRAVENOUS
  Filled 2020-12-28: qty 1

## 2020-12-28 MED ORDER — METHOCARBAMOL 500 MG PO TABS: 500.0000 mg | ORAL_TABLET | Freq: Three times a day (TID) | ORAL | 0 refills | Status: DC | PRN

## 2020-12-28 MED ORDER — IBUPROFEN 800 MG PO TABS: 800.0000 mg | ORAL_TABLET | Freq: Three times a day (TID) | ORAL | 0 refills | Status: DC | PRN

## 2020-12-28 MED ORDER — METHOCARBAMOL 1000 MG/10ML IJ SOLN
500.0000 mg | Freq: Once | INTRAMUSCULAR | Status: AC
  Administered 2020-12-28: 500 mg via INTRAVENOUS
  Filled 2020-12-28: qty 5

## 2020-12-28 MED ORDER — HYDROMORPHONE HCL 1 MG/ML IJ SOLN
0.5000 mg | Freq: Once | INTRAMUSCULAR | Status: AC
  Administered 2020-12-28: 0.5 mg via INTRAVENOUS
  Filled 2020-12-28: qty 1
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
## 2020-12-28 NOTE — ED Notes (Signed)
Pt's visitor asked this nurse to help get the pt cleaned up. This nurse went to find bath wipes. As this nurse was getting bath wipes Code blue button was pressed in room by same visitor. Pt was assessed and noted to be at baseline. Pt's visitor stated "are you guys just gonna leave her sitting in blood". This nurse along with charge and another nurse assisted pt in getting cleaned up. Pt noted to have scant amount of vaginal bleeding. Pt and visitor educated on use of call bell.

## 2020-12-28 NOTE — Discharge Instructions (Signed)
You may alternate Tylenol 1000 mg every 6 hours as needed for pain, fever and Ibuprofen 800 mg every 8 hours as needed for pain, fever.  Please take Ibuprofen with food.  Do not take more than 4000 mg of Tylenol (acetaminophen) in a 24 hour period.   Your labs, urine, CT imaging and x-rays today were normal.  You will be more sore over the next few days than you are today.  Please rest, drink plenty of fluids.  You may take Robaxin as needed for pain.  This is a muscle relaxer.  It is safe in breast-feeding but you will need to monitor your baby for any signs of sedation.  I recommend that you only use this medication if absolutely necessary.  If you do use this medication, you should update your child's pediatric team given your child is currently in the pediatric ICU.  None of the medications given to you today nor the IV contrast is unsafe with breast-feeding.  You do NOT need to pump and dump.

## 2020-12-28 NOTE — ED Notes (Signed)
Ace wrap applied to Rt knee. Crutches given to Pt and instructed on use. Pt verbalizes understanding.

## 2021-01-06 ENCOUNTER — Ambulatory Visit: Payer: Medicaid Other | Admitting: Obstetrics and Gynecology

## 2021-01-11 ENCOUNTER — Ambulatory Visit: Payer: Medicaid Other | Admitting: Obstetrics and Gynecology

## 2021-01-19 ENCOUNTER — Other Ambulatory Visit: Payer: Self-pay

## 2021-01-19 ENCOUNTER — Emergency Department: Payer: Medicaid Other

## 2021-01-19 ENCOUNTER — Emergency Department
Admission: EM | Admit: 2021-01-19 | Discharge: 2021-01-19 | Disposition: A | Discharge status: Home / Self Care | Payer: Medicaid Other | Attending: Emergency Medicine | Admitting: Emergency Medicine

## 2021-01-19 ENCOUNTER — Encounter: Payer: Self-pay | Admitting: Emergency Medicine

## 2021-01-19 DIAGNOSIS — B9689 Other specified bacterial agents as the cause of diseases classified elsewhere: Secondary | ICD-10-CM | POA: Diagnosis not present

## 2021-01-19 DIAGNOSIS — I10 Essential (primary) hypertension: Secondary | ICD-10-CM | POA: Diagnosis not present

## 2021-01-19 DIAGNOSIS — N760 Acute vaginitis: Secondary | ICD-10-CM | POA: Insufficient documentation

## 2021-01-19 DIAGNOSIS — R102 Pelvic and perineal pain: Secondary | ICD-10-CM | POA: Diagnosis not present

## 2021-01-19 DIAGNOSIS — J45909 Unspecified asthma, uncomplicated: Secondary | ICD-10-CM | POA: Insufficient documentation

## 2021-01-19 DIAGNOSIS — N939 Abnormal uterine and vaginal bleeding, unspecified: Secondary | ICD-10-CM | POA: Insufficient documentation

## 2021-01-19 LAB — URINALYSIS, COMPLETE (UACMP) WITH MICROSCOPIC
Bacteria, UA: NONE SEEN
Bilirubin Urine: NEGATIVE
Glucose, UA: NEGATIVE mg/dL
Ketones, ur: NEGATIVE mg/dL
Leukocytes,Ua: NEGATIVE
Nitrite: NEGATIVE
Protein, ur: NEGATIVE mg/dL
Specific Gravity, Urine: 1.025 (ref 1.005–1.030)
pH: 6 (ref 5.0–8.0)

## 2021-01-19 LAB — WET PREP, GENITAL
Sperm: NONE SEEN
Trich, Wet Prep: NONE SEEN
Yeast Wet Prep HPF POC: NONE SEEN

## 2021-01-19 LAB — BASIC METABOLIC PANEL
Anion gap: 9 (ref 5–15)
BUN: 19 mg/dL (ref 6–20)
CO2: 19 mmol/L — ABNORMAL LOW (ref 22–32)
Calcium: 8.9 mg/dL (ref 8.9–10.3)
Chloride: 110 mmol/L (ref 98–111)
Creatinine, Ser: 0.81 mg/dL (ref 0.44–1.00)
GFR, Estimated: >60 mL/min (ref >60)
Glucose, Bld: 117 mg/dL — ABNORMAL HIGH (ref 70–99)
Potassium: 3.6 mmol/L (ref 3.5–5.1)
Sodium: 138 mmol/L (ref 135–145)

## 2021-01-19 LAB — CBC
HCT: 35.5 % — ABNORMAL LOW (ref 36.0–46.0)
Hemoglobin: 11.7 g/dL — ABNORMAL LOW (ref 12.0–15.0)
MCH: 27.1 pg (ref 26.0–34.0)
MCHC: 33 g/dL (ref 30.0–36.0)
MCV: 82.2 fL (ref 80.0–100.0)
Platelets: 240 10*3/uL (ref 150–400)
RBC: 4.32 MIL/uL (ref 3.87–5.11)
RDW: 13.4 % (ref 11.5–15.5)
WBC: 5.8 10*3/uL (ref 4.0–10.5)
nRBC: 0 % (ref 0.0–0.2)

## 2021-01-19 LAB — HCG, QUANTITATIVE, PREGNANCY: hCG, Beta Chain, Quant, S: <1 m[IU]/mL (ref <5)

## 2021-01-19 MED ORDER — METRONIDAZOLE 500 MG PO TABS: 500.0000 mg | ORAL_TABLET | Freq: Two times a day (BID) | ORAL | 0 refills | Status: AC

## 2021-01-19 MED ORDER — ACETAMINOPHEN 160 MG/5ML PO SOLN
1000.0000 mg | Freq: Once | ORAL | Status: DC
  Filled 2021-01-19: qty 40.6

## 2021-01-19 MED ORDER — ACETAMINOPHEN 500 MG PO TABS
1000.0000 mg | ORAL_TABLET | Freq: Once | ORAL | Status: AC
  Administered 2021-01-19: 1000 mg via ORAL
  Filled 2021-01-19: qty 2
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
## 2021-01-19 NOTE — ED Notes (Signed)
Pt was unable to void. Pt states "it hurts on the inside" when she urinates. Dr Larinda Buttery performed pelvic exam and collected cervical specimens.  No blood noted on peri pad. No bleeding noted with provider exam.
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
## 2021-01-19 NOTE — ED Notes (Signed)
Pt to bathroom to void and collect POC urine specimen. Steady gait noted.  Pt on phone with family.

## 2021-01-19 NOTE — ED Notes (Signed)
Aunt at bedside
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
## 2021-01-19 NOTE — ED Notes (Signed)
Pt to ultrasound

## 2021-03-14 ENCOUNTER — Other Ambulatory Visit: Payer: Self-pay

## 2021-03-14 ENCOUNTER — Emergency Department: Payer: Medicaid Other

## 2021-03-14 ENCOUNTER — Emergency Department
Admission: EM | Admit: 2021-03-14 | Discharge: 2021-03-14 | Disposition: A | Discharge status: Home / Self Care | Payer: Medicaid Other | Attending: Emergency Medicine | Admitting: Emergency Medicine

## 2021-03-14 DIAGNOSIS — I10 Essential (primary) hypertension: Secondary | ICD-10-CM | POA: Insufficient documentation

## 2021-03-14 DIAGNOSIS — S39012A Strain of muscle, fascia and tendon of lower back, initial encounter: Secondary | ICD-10-CM | POA: Insufficient documentation

## 2021-03-14 DIAGNOSIS — X501XXA Overexertion from prolonged static or awkward postures, initial encounter: Secondary | ICD-10-CM | POA: Insufficient documentation

## 2021-03-14 DIAGNOSIS — Y9263 Factory as the place of occurrence of the external cause: Secondary | ICD-10-CM | POA: Diagnosis not present

## 2021-03-14 DIAGNOSIS — J45909 Unspecified asthma, uncomplicated: Secondary | ICD-10-CM | POA: Diagnosis not present

## 2021-03-14 DIAGNOSIS — R1030 Lower abdominal pain, unspecified: Secondary | ICD-10-CM | POA: Insufficient documentation

## 2021-03-14 DIAGNOSIS — R1084 Generalized abdominal pain: Secondary | ICD-10-CM | POA: Diagnosis not present

## 2021-03-14 DIAGNOSIS — Y990 Civilian activity done for income or pay: Secondary | ICD-10-CM | POA: Diagnosis not present

## 2021-03-14 DIAGNOSIS — S3992XA Unspecified injury of lower back, initial encounter: Secondary | ICD-10-CM | POA: Diagnosis present

## 2021-03-14 DIAGNOSIS — T148XXA Other injury of unspecified body region, initial encounter: Secondary | ICD-10-CM

## 2021-03-14 LAB — COMPREHENSIVE METABOLIC PANEL
ALT: 37 U/L (ref 0–44)
AST: 23 U/L (ref 15–41)
Albumin: 3.9 g/dL (ref 3.5–5.0)
Alkaline Phosphatase: 34 U/L — ABNORMAL LOW (ref 38–126)
Anion gap: 7 (ref 5–15)
BUN: 17 mg/dL (ref 6–20)
CO2: 22 mmol/L (ref 22–32)
Calcium: 9.2 mg/dL (ref 8.9–10.3)
Chloride: 110 mmol/L (ref 98–111)
Creatinine, Ser: 0.62 mg/dL (ref 0.44–1.00)
GFR, Estimated: >60 mL/min (ref >60)
Glucose, Bld: 117 mg/dL — ABNORMAL HIGH (ref 70–99)
Potassium: 3.5 mmol/L (ref 3.5–5.1)
Sodium: 139 mmol/L (ref 135–145)
Total Bilirubin: 0.6 mg/dL (ref 0.3–1.2)
Total Protein: 7.1 g/dL (ref 6.5–8.1)

## 2021-03-14 LAB — URINALYSIS, COMPLETE (UACMP) WITH MICROSCOPIC
Bacteria, UA: NONE SEEN
Bilirubin Urine: NEGATIVE
Glucose, UA: NEGATIVE mg/dL
Ketones, ur: NEGATIVE mg/dL
Leukocytes,Ua: NEGATIVE
Nitrite: NEGATIVE
Protein, ur: NEGATIVE mg/dL
Specific Gravity, Urine: 1.001 — ABNORMAL LOW (ref 1.005–1.030)
pH: 6 (ref 5.0–8.0)

## 2021-03-14 LAB — CBC
HCT: 36.9 % (ref 36.0–46.0)
Hemoglobin: 12 g/dL (ref 12.0–15.0)
MCH: 26.9 pg (ref 26.0–34.0)
MCHC: 32.5 g/dL (ref 30.0–36.0)
MCV: 82.7 fL (ref 80.0–100.0)
Platelets: 267 10*3/uL (ref 150–400)
RBC: 4.46 MIL/uL (ref 3.87–5.11)
RDW: 14.3 % (ref 11.5–15.5)
WBC: 6.8 10*3/uL (ref 4.0–10.5)
nRBC: 0 % (ref 0.0–0.2)

## 2021-03-14 MED ORDER — DICYCLOMINE HCL 10 MG PO CAPS
10.0000 mg | ORAL_CAPSULE | Freq: Once | ORAL | Status: AC
  Administered 2021-03-14: 10 mg via ORAL
  Filled 2021-03-14: qty 1

## 2021-03-14 MED ORDER — KETOROLAC TROMETHAMINE 30 MG/ML IJ SOLN
15.0000 mg | Freq: Once | INTRAMUSCULAR | Status: AC
  Administered 2021-03-14: 15 mg via INTRAVENOUS
  Filled 2021-03-14: qty 1

## 2021-03-14 MED ORDER — CYCLOBENZAPRINE HCL 10 MG PO TABS
10.0000 mg | ORAL_TABLET | Freq: Once | ORAL | Status: AC
  Administered 2021-03-14: 10 mg via ORAL
  Filled 2021-03-14: qty 1

## 2021-03-14 MED ORDER — CYCLOBENZAPRINE HCL 10 MG PO TABS: 10.0000 mg | ORAL_TABLET | Freq: Three times a day (TID) | ORAL | 0 refills | Status: DC | PRN

## 2021-03-14 MED ORDER — IOHEXOL 300 MG/ML  SOLN
100.0000 mL | Freq: Once | INTRAMUSCULAR | Status: AC | PRN
  Administered 2021-03-14: 100 mL via INTRAVENOUS

## 2021-03-14 NOTE — ED Notes (Signed)
Pt in rad 

## 2021-03-14 NOTE — ED Notes (Signed)
Pt asleep and remained sleepy when awakened. Minimal communication even when asked questions. No s/s of distress.

## 2021-03-14 NOTE — ED Notes (Signed)
Pt's urine HCG test was negative, charted under POC.

## 2021-03-14 NOTE — ED Triage Notes (Signed)
Pt states is having pelvic pain, lower abd pain and lower back pain. Pt states she is three months post partum and is breastfeeding. Pt states has been having vaginal bleeding, but none today.
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
## 2021-03-14 NOTE — Discharge Instructions (Addendum)

## 2021-03-15 LAB — POC URINE PREG, ED: Preg Test, Ur: NEGATIVE

## 2021-06-29 ENCOUNTER — Ambulatory Visit: Payer: Medicaid Other

## 2021-11-22 ENCOUNTER — Telehealth: Payer: Self-pay

## 2021-11-22 NOTE — Telephone Encounter (Signed)
Pt called wanting a call back.  [phone]  Pt states she is having back pain d/t her breasts; is a size 42-I; adv pt we haven't seen her in a while so we would need to see her for an annual so we will have documentation to send with the referral.  Tx'd to SP to schedule annual. ?

## 2021-11-24 ENCOUNTER — Other Ambulatory Visit (HOSPITAL_COMMUNITY)
Admission: RE | Admit: 2021-11-24 | Discharge: 2021-11-24 | Disposition: A | Discharge status: Home / Self Care | Payer: Medicaid Other | Source: Ambulatory Visit | Attending: Obstetrics and Gynecology | Admitting: Obstetrics and Gynecology

## 2021-11-24 ENCOUNTER — Ambulatory Visit (INDEPENDENT_AMBULATORY_CARE_PROVIDER_SITE_OTHER): Payer: Medicaid Other | Admitting: Obstetrics and Gynecology

## 2021-11-24 ENCOUNTER — Other Ambulatory Visit: Payer: Self-pay

## 2021-11-24 ENCOUNTER — Encounter: Payer: Self-pay | Admitting: Obstetrics and Gynecology

## 2021-11-24 VITALS — BP 112/70 | Ht 63.0 in | Wt 249.0 lb

## 2021-11-24 DIAGNOSIS — Z113 Encounter for screening for infections with a predominantly sexual mode of transmission: Secondary | ICD-10-CM | POA: Diagnosis present

## 2021-11-24 DIAGNOSIS — Z30011 Encounter for initial prescription of contraceptive pills: Secondary | ICD-10-CM | POA: Diagnosis not present

## 2021-11-24 DIAGNOSIS — Z124 Encounter for screening for malignant neoplasm of cervix: Secondary | ICD-10-CM | POA: Diagnosis present

## 2021-11-24 DIAGNOSIS — Z01419 Encounter for gynecological examination (general) (routine) without abnormal findings: Secondary | ICD-10-CM | POA: Diagnosis not present

## 2021-11-24 MED ORDER — MICROGESTIN 24 FE 1-20 MG-MCG PO TABS: 1.0000 | ORAL_TABLET | Freq: Every day | ORAL | 3 refills | Status: DC

## 2021-11-24 NOTE — Patient Instructions (Signed)
I value your feedback and you entrusting us with your care. If you get a [facility] patient survey, I would appreciate you taking the time to let us know about your experience today. Thank you! ? ? ?

## 2021-11-24 NOTE — Progress Notes (Signed)
? ?PCP:  [facility] ? ? ?Chief Complaint  ?Patient presents with  ? Gynecologic Exam  ?  Back pain due to breast weight   ? ? ? ?HPI: ?     [name] is a 21 y.o. G1P1001 whose LMP was Patient's last menstrual period was 10/07/2021 (exact date)., presents today for her annual examination.  Her menses are monthly, lasting 6-7 days, mod flow with small clots, no BTB, no dysmen. Would like to restart BC. Did depo PP with wt gain.  ? ?Sex activity: not sexually active.  ?Last Pap: N/A in past due to age ?Hx of STDs: none ? ?There is a FH of breast cancer in her MGM, age unknown. There is no FH of ovarian cancer. The patient does do self-breast exams. ? ?Tobacco use: The patient denies current or previous tobacco use. ?Alcohol use: none ?No drug use.  ?Exercise: moderately active ? ?She does get adequate calcium and Vitamin D in her diet. ? ?Would like ref for breast reduction. Has always had large breasts. Causing back pain. Pt just stopped pumping/BF. Gained wt with depo and pregnancy but breasts have been the same size, R>L. Is trying to lose wt. ? ?Patient Active Problem List  ? Diagnosis Date Noted  ? Supervision of high risk pregnancy, antepartum 06/28/2020  ? Encounter for supervision of high risk pregnancy due to fetal anomaly, second trimester 06/28/2020  ? Pregnancy 05/01/2020  ? ? ?Past Surgical History:  ?Procedure Laterality Date  ? NO PAST SURGERIES    ? ? ?Family History  ?Problem Relation Age of Onset  ? Breast cancer Maternal Grandmother   ?     not sure of age  ? ? ?Social History  ? ?Socioeconomic History  ? Marital status: Single  ?  Spouse name: Not on file  ? Number of children: Not on file  ? Years of education: Not on file  ? Highest education level: Not on file  ?Occupational History  ? Not on file  ?Tobacco Use  ? Smoking status: Never  ? Smokeless tobacco: Never  ?Vaping Use  ? Vaping Use: Never used  ?Substance and Sexual Activity  ? Alcohol use: Never  ? Drug  use: Never  ? Sexual activity: Not Currently  ?  Birth control/protection: None  ?Other Topics Concern  ? Not on file  ?Social History Narrative  ? Not on file  ? ?Social Determinants of Health  ? ?Financial Resource Strain: Not on file  ?Food Insecurity: Not on file  ?Transportation Needs: Not on file  ?Physical Activity: Not on file  ?Stress: Not on file  ?Social Connections: Not on file  ?Intimate Partner Violence: Not on file  ? ? ? ?Current Outpatient Medications:  ?  diphenhydrAMINE (BENADRYL ALLERGY) 25 mg capsule, Take 1 capsule (25 mg total) by mouth every 6 (six) hours as needed for itching or allergies., Disp: 30 capsule, Rfl: 0 ?  Norethindrone Acetate-Ethinyl Estrad-FE (MICROGESTIN 24 FE) 1-20 MG-MCG(24) tablet, Take 1 tablet by mouth daily., Disp: 84 tablet, Rfl: 3 ?  PROAIR HFA 108 (90 Base) MCG/ACT inhaler, Inhale 2 puffs into the lungs as needed., Disp: , Rfl:  ?  triamcinolone cream (KENALOG) 0.1 %, Apply 1 application topically 2 (two) times daily., Disp: , Rfl:  ?  cetirizine (ZYRTEC ALLERGY) 10 MG tablet, Take 1 tablet (10 mg total) by mouth daily for 14 days., Disp: 14 tablet, Rfl: 0 ?  cyclobenzaprine (FLEXERIL) 10 MG tablet, Take  1 tablet (10 mg total) by mouth 3 (three) times daily as needed for muscle spasms. (Patient not taking: Reported on 11/24/2021), Disp: 30 tablet, Rfl: 0 ?  methocarbamol (ROBAXIN) 500 MG tablet, Take 1 tablet (500 mg total) by mouth every 8 (eight) hours as needed for muscle spasms. (Patient not taking: Reported on 11/24/2021), Disp: 15 tablet, Rfl: 0 ? ? ? ? ?ROS: ? ?Review of Systems  ?Constitutional:  Negative for fatigue, fever and unexpected weight change.  ?Respiratory:  Negative for cough, shortness of breath and wheezing.   ?Cardiovascular:  Negative for chest pain, palpitations and leg swelling.  ?Gastrointestinal:  Negative for blood in stool, constipation, diarrhea, nausea and vomiting.  ?Endocrine: Negative for cold intolerance, heat intolerance and  polyuria.  ?Genitourinary:  Negative for dyspareunia, dysuria, flank pain, frequency, genital sores, hematuria, menstrual problem, pelvic pain, urgency, vaginal bleeding, vaginal discharge and vaginal pain.  ?Musculoskeletal:  Negative for back pain, joint swelling and myalgias.  ?Skin:  Negative for rash.  ?Neurological:  Negative for dizziness, syncope, light-headedness, numbness and headaches.  ?Hematological:  Negative for adenopathy.  ?Psychiatric/Behavioral:  Negative for agitation, confusion, sleep disturbance and suicidal ideas. The patient is not nervous/anxious.   ?BREAST: No symptoms ? ? ?Objective: ?BP 112/70   Ht 5\' 3"  (1.6 m)   Wt 249 lb (112.9 kg)   LMP 10/07/2021 (Exact Date)   Breastfeeding Yes   BMI 44.11 kg/m?  ? ? ?Physical Exam ?Constitutional:   ?   Appearance: She is well-developed.  ?Genitourinary:  ?   Vulva normal.  ?   Genitourinary Comments: LARGE, PENDULOUS BREASTS; NO MASSES  ?   Right Labia: No rash, tenderness or lesions. ?   Left Labia: No tenderness, lesions or rash. ?   No vaginal discharge, erythema or tenderness.  ? ?   Right Adnexa: not tender and no mass present. ?   Left Adnexa: not tender and no mass present. ?   No cervical friability or polyp.  ?   Uterus is not enlarged or tender.  ?Breasts: ?   Right: No mass, nipple discharge, skin change or tenderness.  ?   Left: No mass, nipple discharge, skin change or tenderness.  ?Neck:  ?   Thyroid: No thyromegaly.  ?Cardiovascular:  ?   Rate and Rhythm: Normal rate and regular rhythm.  ?   Heart sounds: Normal heart sounds. No murmur heard. ?Pulmonary:  ?   Effort: Pulmonary effort is normal.  ?   Breath sounds: Normal breath sounds.  ?Abdominal:  ?   Palpations: Abdomen is soft.  ?   Tenderness: There is no abdominal tenderness. There is no guarding or rebound.  ?Musculoskeletal:     ?   General: Normal range of motion.  ?   Cervical back: Normal range of motion.  ?Lymphadenopathy:  ?   Cervical: No cervical adenopathy.   ?Neurological:  ?   General: No focal deficit present.  ?   Mental Status: She is alert and oriented to person, place, and time.  ?   Cranial Nerves: No cranial nerve deficit.  ?Skin: ?   General: Skin is warm and dry.  ?Psychiatric:     ?   Mood and Affect: Mood normal.     ?   Behavior: Behavior normal.     ?   Thought Content: Thought content normal.     ?   Judgment: Judgment normal.  ?Vitals reviewed.  ? ? ?Assessment/Plan: ?Encounter for annual routine gynecological examination ? ?  Cervical cancer screening - Plan: Cytology - PAP ? ?Screening for STD (sexually transmitted disease) - Plan: Cytology - PAP ? ?Encounter for initial prescription of contraceptive pills - Plan: Norethindrone Acetate-Ethinyl Estrad-FE (MICROGESTIN 24 FE) 1-20 MG-MCG(24) tablet; OCP start with next menses, condoms.  ? ?Large breasts - Plan: Ambulatory referral to Plastic Surgery; refer to plastic surg for reduction. Recommended wt loss with PCP with Rx meds.  ? ? ?Meds ordered this encounter  ?Medications  ? Norethindrone Acetate-Ethinyl Estrad-FE (MICROGESTIN 24 FE) 1-20 MG-MCG(24) tablet  ?  Sig: Take 1 tablet by mouth daily.  ?  Dispense:  84 tablet  ?  Refill:  3  ?  Order Specific Question:   Supervising Provider  ?  AnswerNadara Mustard [[id number]]  ? ?          ?GYN counsel family planning choices, adequate intake of calcium and vitamin D, diet and exercise ? ? ?  F/U ? Return in about 1 year (around 11/25/2022). ? ?Kenn Rekowski B. Earleen Aoun, PA-C ?11/24/2021 ?4:46 PM ?

## 2021-11-28 LAB — CYTOLOGY - PAP
Chlamydia: NEGATIVE
Comment: NEGATIVE
Comment: NORMAL
Diagnosis: NEGATIVE
Neisseria Gonorrhea: NEGATIVE

## 2021-12-19 ENCOUNTER — Institutional Professional Consult (permissible substitution): Payer: Medicaid Other | Admitting: Plastic Surgery

## 2022-01-09 ENCOUNTER — Institutional Professional Consult (permissible substitution): Payer: Medicaid Other | Admitting: Plastic Surgery

## 2022-03-18 ENCOUNTER — Telehealth: Payer: Self-pay | Admitting: Plastic Surgery

## 2022-03-18 NOTE — Telephone Encounter (Signed)
Patient notified of office change and would like to see Dr. Domenica Reamer 7/12.  Change made in computer.

## 2022-03-22 ENCOUNTER — Institutional Professional Consult (permissible substitution): Payer: Medicaid Other | Admitting: Plastic Surgery
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
## 2022-05-31 ENCOUNTER — Telehealth: Payer: Self-pay

## 2022-05-31 ENCOUNTER — Encounter: Payer: Self-pay | Admitting: Obstetrics and Gynecology

## 2022-05-31 DIAGNOSIS — Z6841 Body Mass Index (BMI) 40.0 and over, adult: Secondary | ICD-10-CM

## 2022-05-31 DIAGNOSIS — R635 Abnormal weight gain: Secondary | ICD-10-CM

## 2022-05-31 NOTE — Telephone Encounter (Signed)
Pt called triage line to request weight loss Rx. Saw [facility] in March 2023 and was advised to reach out to PCP for weight loss, she has but appts are out until March 2024. Wants to let [facility] know the [facility] she's taking is making her gain more weight than when she was on depo. Please advise.

## 2022-05-31 NOTE — Telephone Encounter (Signed)
Addressing with pt through [employer]

## 2022-06-02 ENCOUNTER — Other Ambulatory Visit: Payer: Medicaid Other

## 2022-06-02 DIAGNOSIS — Z6841 Body Mass Index (BMI) 40.0 and over, adult: Secondary | ICD-10-CM

## 2022-06-02 DIAGNOSIS — R635 Abnormal weight gain: Secondary | ICD-10-CM

## 2022-06-02 DIAGNOSIS — R634 Abnormal weight loss: Secondary | ICD-10-CM

## 2022-06-03 LAB — COMPREHENSIVE METABOLIC PANEL
ALT: 17 IU/L (ref 0–32)
AST: 13 IU/L (ref 0–40)
Albumin/Globulin Ratio: 2.2 (ref 1.2–2.2)
Albumin: 4.4 g/dL (ref 4.0–5.0)
Alkaline Phosphatase: 44 IU/L (ref 44–121)
BUN/Creatinine Ratio: 32 — ABNORMAL HIGH (ref 9–23)
BUN: 20 mg/dL (ref 6–20)
Bilirubin Total: 0.5 mg/dL (ref 0.0–1.2)
CO2: 22 mmol/L (ref 20–29)
Calcium: 9.7 mg/dL (ref 8.7–10.2)
Chloride: 105 mmol/L (ref 96–106)
Creatinine, Ser: 0.63 mg/dL (ref 0.57–1.00)
Globulin, Total: 2 g/dL (ref 1.5–4.5)
Glucose: 97 mg/dL (ref 70–99)
Potassium: 3.9 mmol/L (ref 3.5–5.2)
Sodium: 139 mmol/L (ref 134–144)
Total Protein: 6.4 g/dL (ref 6.0–8.5)
eGFR: 129 mL/min/{1.73_m2} (ref >59)

## 2022-06-03 LAB — HEMOGLOBIN A1C
Est. average glucose Bld gHb Est-mCnc: 97 mg/dL
Hgb A1c MFr Bld: 5 % (ref 4.8–5.6)

## 2022-06-03 LAB — TSH+FREE T4
Free T4: 1.24 ng/dL (ref 0.82–1.77)
TSH: 0.71 u[IU]/mL (ref 0.450–4.500)
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
## 2022-07-20 ENCOUNTER — Ambulatory Visit (INDEPENDENT_AMBULATORY_CARE_PROVIDER_SITE_OTHER): Payer: Medicaid Other

## 2022-07-20 ENCOUNTER — Other Ambulatory Visit (HOSPITAL_COMMUNITY)
Admission: RE | Admit: 2022-07-20 | Discharge: 2022-07-20 | Disposition: A | Discharge status: Home / Self Care | Payer: Medicaid Other | Source: Ambulatory Visit | Attending: Obstetrics and Gynecology | Admitting: Obstetrics and Gynecology

## 2022-07-20 VITALS — BP 106/74 | HR 69 | Resp 16 | Wt 252.3 lb

## 2022-07-20 DIAGNOSIS — Z202 Contact with and (suspected) exposure to infections with a predominantly sexual mode of transmission: Secondary | ICD-10-CM | POA: Insufficient documentation

## 2022-07-20 NOTE — Progress Notes (Signed)
Sexually Transmitted Disease Check Patient presents for sexually transmitted disease check. Sexual history reviewed with the patient. STD exposure: denies knowledge of risky exposure.  Previous history of STD:  none. Current symptoms include none.  Contraception: abstinence.

## 2022-07-24 ENCOUNTER — Other Ambulatory Visit: Payer: Self-pay | Admitting: Certified Nurse Midwife

## 2022-07-24 ENCOUNTER — Encounter: Payer: Self-pay | Admitting: Certified Nurse Midwife

## 2022-07-24 LAB — CERVICOVAGINAL ANCILLARY ONLY
Bacterial Vaginitis (gardnerella): NEGATIVE
Candida Glabrata: NEGATIVE
Candida Vaginitis: POSITIVE — AB
Chlamydia: POSITIVE — AB
Comment: NEGATIVE
Comment: NEGATIVE
Comment: NEGATIVE
Comment: NEGATIVE
Comment: NEGATIVE
Comment: NORMAL
Neisseria Gonorrhea: NEGATIVE
Trichomonas: NEGATIVE

## 2022-07-24 MED ORDER — FLUCONAZOLE 150 MG PO TABS: 150.0000 mg | ORAL_TABLET | Freq: Once | ORAL | 0 refills | Status: AC

## 2022-07-24 MED ORDER — DOXYCYCLINE MONOHYDRATE 100 MG PO TABS: 100.0000 mg | ORAL_TABLET | Freq: Two times a day (BID) | ORAL | 0 refills | Status: AC

## 2022-08-02 ENCOUNTER — Ambulatory Visit: Payer: Medicaid Other

## 2022-09-11 NOTE — L&D Delivery Note (Signed)
Delivery Note  First Stage: Labor onset: 10pm Augmentation : none Analgesia /Anesthesia intrapartum: none SROM after delivery, infant born en caul  Second Stage: Complete dilation at 0108 (noted by visible fetal head and maternal active pushing) Onset of pushing at 0108 FHR second stage not traced d/t moving patient down the hallway into a labor room and delivering upon arrival to the labor room, FHR noted to be having prolonged deceleration when unplugged to take to a labor room.  Delivery of a viable female infant 07/09/2023 at 0112 by Donato Schultz, CNM. delivery of fetal head in OP position with restitution to LOP. No nuchal cord;  Anterior then posterior shoulders delivered easily with gentle downward traction. Bag of water opened and removed from infant by CNM. Mother delivered hands and knees on the triage bed. Assisted mother to the L&D bed and infant passed between her legs to her arms. Baby placed on mom's chest, and attended to by peds.  Cord double clamped after cessation of pulsation, cut by patient's mother.  Third Stage: Placenta delivered Tomasa Blase intact with 3 VC @ 0120 Placenta disposition: discarded Uterine tone firm / bleeding mild  No laceration identified  Anesthesia for repair: none needed Repair none needed Est. Blood Loss (mL):  Complications: none  Mom to postpartum.  Baby to Couplet care / Skin to Skin.  Newborn: Birth Weight: TBD, infant skin-to-skin  Apgar Scores: 8, 9 Feeding planned: formula

## 2022-09-18 ENCOUNTER — Other Ambulatory Visit (HOSPITAL_COMMUNITY)
Admission: RE | Admit: 2022-09-18 | Discharge: 2022-09-18 | Disposition: A | Discharge status: Home / Self Care | Payer: Medicaid Other | Source: Ambulatory Visit | Attending: Obstetrics and Gynecology | Admitting: Obstetrics and Gynecology

## 2022-09-18 ENCOUNTER — Ambulatory Visit (INDEPENDENT_AMBULATORY_CARE_PROVIDER_SITE_OTHER): Payer: Medicaid Other

## 2022-09-18 VITALS — BP 100/67 | HR 93 | Ht 63.0 in | Wt 247.0 lb

## 2022-09-18 DIAGNOSIS — Z202 Contact with and (suspected) exposure to infections with a predominantly sexual mode of transmission: Secondary | ICD-10-CM

## 2022-09-18 DIAGNOSIS — B3731 Acute candidiasis of vulva and vagina: Secondary | ICD-10-CM

## 2022-09-18 DIAGNOSIS — A599 Trichomoniasis, unspecified: Secondary | ICD-10-CM

## 2022-09-18 NOTE — Progress Notes (Unsigned)
    NURSE VISIT NOTE  Subjective:    Patient ID: Toy Cookey, female    DOB: 2000/12/23, 22 y.o.   MRN: [id number]  HPI  Patient is a 22 y.o. G54P1001 female who presents for self swab denies any symptom only needs the swab done for work purpose .  Objective:    BP 100/67   Pulse 93   Ht 5\' 3"  (1.6 m)   Wt 247 lb (112 kg)   LMP  (LMP Unknown)   Breastfeeding Yes   BMI 43.75 kg/m    @THIS  VISIT ONLY@  Assessment:   1. Encounter for assessment of STD exposure       Plan:   GC and chlamydia DNA  probe sent to lab.    Landis Gandy, [place]

## 2022-09-20 ENCOUNTER — Encounter: Payer: Self-pay | Admitting: Obstetrics and Gynecology

## 2022-09-20 DIAGNOSIS — A599 Trichomoniasis, unspecified: Secondary | ICD-10-CM | POA: Insufficient documentation

## 2022-09-20 LAB — CERVICOVAGINAL ANCILLARY ONLY
Bacterial Vaginitis (gardnerella): NEGATIVE
Candida Glabrata: NEGATIVE
Candida Vaginitis: POSITIVE — AB
Chlamydia: NEGATIVE
Comment: NEGATIVE
Comment: NEGATIVE
Comment: NEGATIVE
Comment: NEGATIVE
Comment: NEGATIVE
Comment: NORMAL
Neisseria Gonorrhea: NEGATIVE
Trichomonas: POSITIVE — AB

## 2022-09-20 MED ORDER — FLUCONAZOLE 150 MG PO TABS: 150.0000 mg | ORAL_TABLET | Freq: Once | ORAL | 0 refills | Status: AC

## 2022-09-20 MED ORDER — METRONIDAZOLE 500 MG PO TABS: ORAL_TABLET | ORAL | 0 refills | Status: DC

## 2022-09-21 ENCOUNTER — Other Ambulatory Visit: Payer: Self-pay

## 2022-09-21 DIAGNOSIS — B3731 Acute candidiasis of vulva and vagina: Secondary | ICD-10-CM

## 2022-09-21 DIAGNOSIS — A599 Trichomoniasis, unspecified: Secondary | ICD-10-CM

## 2022-09-21 MED ORDER — FLUCONAZOLE 150 MG PO TABS: 150.0000 mg | ORAL_TABLET | Freq: Once | ORAL | 0 refills | Status: AC

## 2022-10-22 NOTE — Progress Notes (Deleted)
[facility] [place]   No chief complaint on file.   HPI:      [name] is a 22 y.o. G1P1001 whose LMP was No LMP recorded., presents today for trich and yeast on 1/24 culture    Patient Active Problem List   Diagnosis Date Noted   Trichomoniasis 09/20/2022   Supervision of high risk pregnancy, antepartum 06/28/2020   Encounter for supervision of high risk pregnancy due to fetal anomaly, second trimester 06/28/2020   Pregnancy 05/01/2020    Past Surgical History:  Procedure Laterality Date   NO PAST SURGERIES      Family History  Problem Relation Age of Onset   Breast cancer Maternal Grandmother        not sure of age    Social History   Socioeconomic History   Marital status: Single    Spouse name: Not on file   Number of children: Not on file   Years of education: Not on file   Highest education level: Not on file  Occupational History   Not on file  Tobacco Use   Smoking status: Never   Smokeless tobacco: Never  Vaping Use   Vaping Use: Never used  Substance and Sexual Activity   Alcohol use: Never   Drug use: Never   Sexual activity: Not Currently    Birth control/protection: None  Other Topics Concern   Not on file  Social History Narrative   Not on file   Social Determinants of Health   Financial Resource Strain: Not on file  Food Insecurity: Not on file  Transportation Needs: Not on file  Physical Activity: Not on file  Stress: Not on file  Social Connections: Not on file  Intimate Partner Violence: Not on file    Outpatient Medications Prior to Visit  Medication Sig Dispense Refill   cetirizine (ZYRTEC ALLERGY) 10 MG tablet Take 1 tablet (10 mg total) by mouth daily for 14 days. 14 tablet 0   cyclobenzaprine (FLEXERIL) 10 MG tablet Take 1 tablet (10 mg total) by mouth 3 (three) times daily as needed for muscle spasms. (Patient not taking: Reported on 11/24/2021) 30 tablet 0   diphenhydrAMINE (BENADRYL ALLERGY)  25 mg capsule Take 1 capsule (25 mg total) by mouth every 6 (six) hours as needed for itching or allergies. 30 capsule 0   methocarbamol (ROBAXIN) 500 MG tablet Take 1 tablet (500 mg total) by mouth every 8 (eight) hours as needed for muscle spasms. (Patient not taking: Reported on 11/24/2021) 15 tablet 0   metroNIDAZOLE (FLAGYL) 500 MG tablet Take 2 tabs BID for 1 day 4 tablet 0   Norethindrone Acetate-Ethinyl Estrad-FE (MICROGESTIN 24 FE) 1-20 MG-MCG(24) tablet Take 1 tablet by mouth daily. 84 tablet 3   PROAIR HFA 108 (90 Base) MCG/ACT inhaler Inhale 2 puffs into the lungs as needed.     triamcinolone cream (KENALOG) 0.1 % Apply 1 application topically 2 (two) times daily.     No facility-administered medications prior to visit.      ROS:  Review of Systems BREAST: No symptoms   OBJECTIVE:   Vitals:  There were no vitals taken for this visit.  Physical Exam  Results: No results found for this or any previous visit (from the past 24 hour(s)).   Assessment/Plan: No diagnosis found.    No orders of the defined types were placed in this encounter.     No follow-ups on file.  Yaphet Smethurst B. Sarely Stracener, PA-C 10/22/2022  8:45 PM

## 2022-10-23 ENCOUNTER — Other Ambulatory Visit: Payer: Self-pay

## 2022-10-23 ENCOUNTER — Ambulatory Visit (INDEPENDENT_AMBULATORY_CARE_PROVIDER_SITE_OTHER): Payer: Medicaid Other

## 2022-10-23 ENCOUNTER — Ambulatory Visit: Payer: Medicaid Other | Admitting: Obstetrics and Gynecology

## 2022-10-23 VITALS — BP 116/64 | HR 84 | Resp 16 | Ht 64.0 in | Wt 248.6 lb

## 2022-10-23 DIAGNOSIS — Z3201 Encounter for pregnancy test, result positive: Secondary | ICD-10-CM

## 2022-10-23 DIAGNOSIS — Z789 Other specified health status: Secondary | ICD-10-CM

## 2022-10-23 DIAGNOSIS — N912 Amenorrhea, unspecified: Secondary | ICD-10-CM

## 2022-10-23 LAB — POCT URINE PREGNANCY: Preg Test, Ur: POSITIVE — AB

## 2022-10-23 NOTE — Patient Instructions (Signed)
First Trimester of Pregnancy  The first trimester of pregnancy starts on the first day of your last menstrual period until the end of week 12. This is also called months 1 through 3 of pregnancy. Body changes during your first trimester Your body goes through many changes during pregnancy. The changes usually return to normal after your baby is born. Physical changes You may gain or lose weight. Your breasts may grow larger and hurt. The area around your nipples may get darker. Dark spots or blotches may develop on your face. You may have changes in your hair. Health changes You may feel like you might vomit (nauseous), and you may vomit. You may have heartburn. You may have headaches. You may have trouble pooping (constipation). Your gums may bleed. Other changes You may get tired easily. You may pee (urinate) more often. Your menstrual periods will stop. You may not feel hungry. You may want to eat certain kinds of food. You may have changes in your emotions from day to day. You may have more dreams. Follow these instructions at home: Medicines Take over-the-counter and prescription medicines only as told by your doctor. Some medicines are not safe during pregnancy. Take a prenatal vitamin that contains at least 600 micrograms (mcg) of folic acid. Eating and drinking Eat healthy meals that include: Fresh fruits and vegetables. Whole grains. Good sources of protein, such as meat, eggs, or tofu. Low-fat dairy products. Avoid raw meat and unpasteurized juice, milk, and cheese. If you feel like you may vomit, or you vomit: Eat 4 or 5 small meals a day instead of 3 large meals. Try eating a few soda crackers. Drink liquids between meals instead of during meals. You may need to take these actions to prevent or treat trouble pooping: Drink enough fluids to keep your pee (urine) pale yellow. Eat foods that are high in fiber. These include beans, whole grains, and fresh fruits and  vegetables. Limit foods that are high in fat and sugar. These include fried or sweet foods. Activity Exercise only as told by your doctor. Most people can do their usual exercise routine during pregnancy. Stop exercising if you have cramps or pain in your lower belly (abdomen) or low back. Do not exercise if it is too hot or too humid, or if you are in a place of great height (high altitude). Avoid heavy lifting. If you choose to, you may have sex unless your doctor tells you not to. Relieving pain and discomfort Wear a good support bra if your breasts are sore. Rest with your legs raised (elevated) if you have leg cramps or low back pain. If you have bulging veins (varicose veins) in your legs: Wear support hose as told by your doctor. Raise your feet for 15 minutes, 3-4 times a day. Limit salt in your food. Safety Wear your seat belt at all times when you are in a car. Talk with your doctor if someone is hurting you or yelling at you. Talk with your doctor if you are feeling sad or have thoughts of hurting yourself. Lifestyle Do not use hot tubs, steam rooms, or saunas. Do not douche. Do not use tampons or scented sanitary pads. Do not use herbal medicines, illegal drugs, or medicines that are not approved by your doctor. Do not drink alcohol. Do not smoke or use any products that contain nicotine or tobacco. If you need help quitting, ask your doctor. Avoid cat litter boxes and soil that is used by cats. These carry   germs that can cause harm to the baby and can cause a loss of your baby by miscarriage or stillbirth. General instructions Keep all follow-up visits. This is important. Ask for help if you need counseling or if you need help with nutrition. Your doctor can give you advice or tell you where to go for help. Visit your dentist. At home, brush your teeth with a soft toothbrush. Floss gently. Write down your questions. Take them to your prenatal visits. Where to find more  information American Pregnancy Association: americanpregnancy.org [employer]: www.acog.org Office on Women's Health: KeywordPortfolios.com.br Contact a doctor if: You are dizzy. You have a fever. You have mild cramps or pressure in your lower belly. You have a nagging pain in your belly area. You continue to feel like you may vomit, you vomit, or you have watery poop (diarrhea) for 24 hours or longer. You have a bad-smelling fluid coming from your vagina. You have pain when you pee. You are exposed to a disease that spreads from person to person, such as chickenpox, measles, Zika virus, HIV, or hepatitis. Get help right away if: You have spotting or bleeding from your vagina. You have very bad belly cramping or pain. You have shortness of breath or chest pain. You have any kind of injury, such as from a fall or a car crash. You have new or increased pain, swelling, or redness in an arm or leg. Summary The first trimester of pregnancy starts on the first day of your last menstrual period until the end of week 12 (months 1 through 3). Eat 4 or 5 small meals a day instead of 3 large meals. Do not smoke or use any products that contain nicotine or tobacco. If you need help quitting, ask your doctor. Keep all follow-up visits. This information is not intended to replace advice given to you by your health care provider. Make sure you discuss any questions you have with your health care provider. Document Revised: 02/04/2020 Document Reviewed: 12/11/2019 Elsevier Patient Education  [place]. Morning Sickness  Morning sickness is when you feel like you may vomit (feel nauseous) during pregnancy. Sometimes, you may vomit. Morning sickness most often happens in the morning, but it can also happen at any time of the day. Some women may have morning sickness that makes them vomit all the time. This is a more serious problem that needs  treatment. What are the causes? The cause of this condition is not known. What increases the risk? You had vomiting or a feeling like you may vomit before your pregnancy. You had morning sickness in another pregnancy. You are pregnant with more than one baby, such as twins. What are the signs or symptoms? Feeling like you may vomit. Vomiting. How is this treated? Treatment is usually not needed for this condition. You may only need to change what you eat. In some cases, your doctor may give you some things to take for your condition. These include: Vitamin B6 supplements. Medicines to treat the feeling that you may vomit. Ginger. Follow these instructions at home: Medicines Take over-the-counter and prescription medicines only as told by your doctor. Do not take any medicines until you talk with your doctor about them first. Take multivitamins before you get pregnant. These can stop or lessen the symptoms of morning sickness. Eating and drinking Eat dry toast or crackers before getting out of bed. Eat 5 or 6 small meals a day. Eat dry and bland foods like  rice and baked potatoes. Do not eat greasy, fatty, or spicy foods. Have someone cook for you if the smell of food causes you to vomit or to feel like you may vomit. If you feel like you may vomit after taking prenatal vitamins, take them at night or with a snack. Eat protein foods when you need a snack. Nuts, yogurt, and cheese are good choices. Drink fluids throughout the day. Try ginger ale made with real ginger, ginger tea made from fresh grated ginger, or ginger candies. General instructions Do not smoke or use any products that contain nicotine or tobacco. If you need help quitting, ask your doctor. Use an air purifier to keep the air in your house free of smells. Get lots of fresh air. Try to avoid smells that make you feel sick. Try wearing an acupressure wristband. This is a wristband that is used to treat seasickness. Try  a treatment called acupuncture. In this treatment, a doctor puts needles into certain areas of your body to make you feel better. Contact a doctor if: You need medicine to feel better. You feel dizzy or light-headed. You are losing weight. Get help right away if: The feeling that you may vomit will not go away, or you cannot stop vomiting. You faint. You have very bad pain in your belly. Summary Morning sickness is when you feel like you may vomit (feel nauseous) during pregnancy. You may feel sick in the morning, but you can feel this way at any time of the day. Making some changes to what you eat may help your symptoms go away. This information is not intended to replace advice given to you by your health care provider. Make sure you discuss any questions you have with your health care provider. Document Revised: 04/12/2020 Document Reviewed: 03/22/2020 Elsevier Patient Education  [place]. Commonly Asked Questions During Pregnancy  Cats: A parasite can be excreted in cat feces.  To avoid exposure you need to have another person empty the little box.  If you must empty the litter box you will need to wear gloves.  Wash your hands after handling your cat.  This parasite can also be found in raw or undercooked meat so this should also be avoided.  Colds, Sore Throats, Flu: Please check your medication sheet to see what you can take for symptoms.  If your symptoms are unrelieved by these medications please call the office.  Dental Work: Most any dental work Investment banker, corporate recommends is permitted.  X-rays should only be taken during the first trimester if absolutely necessary.  Your abdomen should be shielded with a lead apron during all x-rays.  Please notify your provider prior to receiving any x-rays.  Novocaine is fine; gas is not recommended.  If your dentist requires a note from [place] prior to dental work please call the office and we will provide one for you.  Exercise: Exercise is  an important part of staying healthy during your pregnancy.  You may continue most exercises you were accustomed to prior to pregnancy.  Later in your pregnancy you will most likely notice you have difficulty with activities requiring balance like riding a bicycle.  It is important that you listen to your body and avoid activities that put you at a higher risk of falling.  Adequate rest and staying well hydrated are a must!  If you have questions about the safety of specific activities ask your provider.    Exposure to Children with illness: Try to  avoid obvious exposure; report any symptoms to [place] when noted,  If you have chicken pos, red measles or mumps, you should be immune to these diseases.   Please do not take any vaccines while pregnant unless you have checked with your OB provider.  Fetal Movement: After 28 weeks we recommend you do "kick counts" twice daily.  Lie or sit down in a calm quiet environment and count your baby movements "kicks".  You should feel your baby at least 10 times per hour.  If you have not felt 10 kicks within the first hour get up, walk around and have something sweet to eat or drink then repeat for an additional hour.  If count remains less than 10 per hour notify your provider.  Fumigating: Follow your pest control agent's advice as to how long to stay out of your home.  Ventilate the area well before re-entering.  Hemorrhoids:   Most over-the-counter preparations can be used during pregnancy.  Check your medication to see what is safe to use.  It is important to use a stool softener or fiber in your diet and to drink lots of liquids.  If hemorrhoids seem to be getting worse please call the office.   Hot Tubs:  Hot tubs Jacuzzis and saunas are not recommended while pregnant.  These increase your internal body temperature and should be avoided.  Intercourse:  Sexual intercourse is safe during pregnancy as long as you are comfortable, unless otherwise advised by your  provider.  Spotting may occur after intercourse; report any bright red bleeding that is heavier than spotting.  Labor:  If you know that you are in labor, please go to the hospital.  If you are unsure, please call the office and let us help you decide what to do.  Lifting, straining, etc:  If your job requires heavy lifting or straining please check with your provider for any limitations.  Generally, you should not lift items heavier than that you can lift simply with your hands and arms (no back muscles)  Painting:  Paint fumes do not harm your pregnancy, but may make you ill and should be avoided if possible.  Latex or water based paints have less odor than oils.  Use adequate ventilation while painting.  Permanents & Hair Color:  Chemicals in hair dyes are not recommended as they cause increase hair dryness which can increase hair loss during pregnancy.  " Highlighting" and permanents are allowed.  Dye may be absorbed differently and permanents may not hold as well during pregnancy.  Sunbathing:  Use a sunscreen, as skin burns easily during pregnancy.  Drink plenty of fluids; avoid over heating.  Tanning Beds:  Because their possible side effects are still unknown, tanning beds are not recommended.  Ultrasound Scans:  Routine ultrasounds are performed at approximately 20 weeks.  You will be able to see your baby's general anatomy an if you would like to know the gender this can usually be determined as well.  If it is questionable when you conceived you may also receive an ultrasound early in your pregnancy for dating purposes.  Otherwise ultrasound exams are not routinely performed unless there is a medical necessity.  Although you can request a scan we ask that you pay for it when conducted because insurance does not cover " patient request" scans.  Work: If your pregnancy proceeds without complications you may work until your due date, unless your physician or employer advises  otherwise.  Round Ligament Pain/Pelvic Discomfort:  Sharp, shooting pains not associated with bleeding are fairly common, usually occurring in the second trimester of pregnancy.  They tend to be worse when standing up or when you remain standing for long periods of time.  These are the result of pressure of certain pelvic ligaments called "round ligaments".  Rest, Tylenol and heat seem to be the most effective relief.  As the womb and fetus grow, they rise out of the pelvis and the discomfort improves.  Please notify the office if your pain seems different than that described.  It may represent a more serious condition.  Common Medications Safe in Pregnancy  Acne:      Constipation:  Benzoyl Peroxide     Colace  Clindamycin      Dulcolax Suppository  Topica Erythromycin     Fibercon  Salicylic Acid      Metamucil         Miralax AVOID:        Senakot   Accutane    Cough:  Retin-A       Cough Drops  Tetracycline      Phenergan w/ Codeine if Rx  Minocycline      Robitussin (Plain & DM)  Antibiotics:     Crabs/Lice:  Ceclor       RID  Cephalosporins    AVOID:  E-Mycins      Kwell  Keflex  Macrobid/Macrodantin   Diarrhea:  Penicillin      Kao-Pectate  Zithromax      Imodium AD         PUSH FLUIDS AVOID:       Cipro     Fever:  Tetracycline      Tylenol (Regular or Extra  Minocycline       Strength)  Levaquin      Extra Strength-Do not          Exceed 8 tabs/24 hrs Caffeine:        <270m/day (equiv. To 1 cup of coffee or  approx. 3 12 oz sodas)         Gas: Cold/Hayfever:       Gas-X  Benadryl      Mylicon  Claritin       Phazyme  **Claritin-D        Chlor-Trimeton    Headaches:  Dimetapp      ASA-Free Excedrin  Drixoral-Non-Drowsy     Cold Compress  Mucinex (Guaifenasin)     Tylenol (Regular or Extra  Sudafed/Sudafed-12 Hour     Strength)  **Sudafed PE Pseudoephedrine   Tylenol Cold & Sinus     Vicks Vapor Rub  Zyrtec  **AVOID if Problems With Blood  Pressure         Heartburn: Avoid lying down for at least 1 hour after meals  Aciphex      Maalox     Rash:  Milk of Magnesia     Benadryl    Mylanta       1% Hydrocortisone Cream  Pepcid  Pepcid Complete   Sleep Aids:  Prevacid      Ambien   Prilosec       Benadryl  Rolaids       Chamomile Tea  Tums (Limit 4/day)     Unisom         Tylenol PM         Warm milk-add vanilla or  Hemorrhoids:       Sugar for taste  Anusol/Anusol H.C.  (RX: Analapram 2.5%)  Sugar Substitutes:  Hydrocortisone OTC     Ok in moderation  Preparation H      Tucks        Vaseline lotion applied to tissue with wiping    Herpes:     Throat:  Acyclovir      Oragel  Famvir  Valtrex     Vaccines:         Flu Shot Leg Cramps:       *Gardasil  Benadryl      Hepatitis A         Hepatitis B Nasal Spray:       Pneumovax  Saline Nasal Spray     Polio Booster         Tetanus Nausea:       Tuberculosis test or PPD  Vitamin B6 25 mg TID   AVOID:    Dramamine      *Gardasil  Emetrol       Live Poliovirus  Ginger Root 250 mg QID    MMR (measles, mumps &  High Complex Carbs @ Bedtime    rebella)  Sea Bands-Accupressure    Varicella (Chickenpox)  Unisom 1/2 tab TID     *No known complications           If received before Pain:         Known pregnancy;   Darvocet       Resume series after  Lortab        Delivery  Percocet    Yeast:   Tramadol      Femstat  Tylenol 3      Gyne-lotrimin  Ultram       Monistat  Vicodin           MISC:         All Sunscreens           Hair Coloring/highlights          Insect Repellant's          (Including DEET)         Mystic Tans

## 2022-10-23 NOTE — Progress Notes (Signed)
    NURSE VISIT NOTE  Subjective:    Patient ID: Katrina Sherman, female    DOB: 2001-07-24, 22 y.o.   MRN: [id number]  HPI  Patient is a 22 y.o. G34P1001 female who presents for evaluation of amenorrhea. She believes she could be pregnant. Pregnancy is not desired. Sexual Activity: single partner, contraception: OCP (estrogen/progesterone). Current symptoms also include: fatigue, nausea, and positive home pregnancy test. Last period was abnormal. Lmp: Unknown.    Objective:    BP 116/64   Pulse 84   Resp 16   Ht 5\' 4"  (1.626 m)   Wt 248 lb 9.6 oz (112.8 kg)   LMP  (LMP Unknown)   BMI 42.67 kg/m   Lab Review  Results for orders placed or performed in visit on 10/23/22  POCT urine pregnancy  Result Value Ref Range   Preg Test, Ur Positive (A) Negative    Assessment:   1. Amenorrhea     Plan:   Pregnancy Test: Positive  Estimated Date of Delivery: None noted. Encouraged well-balanced diet, plenty of rest when needed, pre-natal vitamins daily and walking for exercise.  Discussed self-help for nausea, avoiding OTC medications until consulting provider or pharmacist, other than Tylenol as needed, minimal caffeine (1-2 cups daily) and avoiding alcohol.   She will schedule her nurse visit @ [redacted] wks pregnant, u/s for dating and labs @10  wk, and NOB visit at [redacted] wk pregnant.    Feel free to call with any questions.    Cristy Folks, [place]

## 2022-10-25 ENCOUNTER — Ambulatory Visit
Admission: RE | Admit: 2022-10-25 | Discharge: 2022-10-25 | Disposition: A | Discharge status: Home / Self Care | Payer: Medicaid Other | Source: Ambulatory Visit | Attending: Obstetrics | Admitting: Obstetrics

## 2022-10-25 DIAGNOSIS — Z789 Other specified health status: Secondary | ICD-10-CM | POA: Insufficient documentation

## 2022-10-29 ENCOUNTER — Encounter: Payer: Self-pay | Admitting: Obstetrics and Gynecology

## 2022-10-29 DIAGNOSIS — Z3201 Encounter for pregnancy test, result positive: Secondary | ICD-10-CM

## 2022-10-29 DIAGNOSIS — Z789 Other specified health status: Secondary | ICD-10-CM

## 2022-11-02 ENCOUNTER — Other Ambulatory Visit: Payer: Medicaid Other

## 2022-11-02 ENCOUNTER — Telehealth: Payer: Self-pay | Admitting: Obstetrics

## 2022-11-02 NOTE — Telephone Encounter (Signed)
Reached out to pt to reschedule lab appt that was scheduled on 11/02/2022 at 3:00.  Was able to reschedule the appt for 11/03/2022 at 11:00.

## 2022-11-03 ENCOUNTER — Telehealth: Payer: Self-pay | Admitting: Obstetrics

## 2022-11-03 ENCOUNTER — Other Ambulatory Visit: Payer: Medicaid Other

## 2022-11-03 NOTE — Telephone Encounter (Signed)
Reached out to pt to reschedule labs that were scheduled on 11/03/2022 at 11:00.  Was able to reschedule to 11/06/2022 at 11:00.

## 2022-11-06 ENCOUNTER — Other Ambulatory Visit: Payer: Medicaid Other

## 2022-11-06 ENCOUNTER — Telehealth: Payer: Self-pay | Admitting: Obstetrics

## 2022-11-06 NOTE — Telephone Encounter (Signed)
Reached out to pt to reschedule lab appt that was scheduled on 11/06/2022 at 11:00.  Was able to reschedule the appt to 11/07/2022 at 11:00.

## 2022-11-07 ENCOUNTER — Other Ambulatory Visit: Payer: Medicaid Other

## 2022-11-09 NOTE — Telephone Encounter (Signed)
Patient did not schedule [facility] for today as instructed by Ardeth Perfect, PA. Left voicemail to contact our office. Will send my chart message to notify patient she can get it drawn at [facility]. Order updated and release for outpatient draw.

## 2022-11-09 NOTE — Addendum Note (Signed)
Addended by: Meryl Dare on: 11/09/2022 04:31 PM   Modules accepted: Orders

## 2022-11-14 ENCOUNTER — Other Ambulatory Visit: Payer: Medicaid Other

## 2022-11-16 MED ORDER — METRONIDAZOLE 500 MG PO TABS: ORAL_TABLET | ORAL | 0 refills | Status: DC

## 2022-11-20 ENCOUNTER — Telehealth: Payer: Self-pay | Admitting: Obstetrics and Gynecology

## 2022-11-20 DIAGNOSIS — Z3201 Encounter for pregnancy test, result positive: Secondary | ICD-10-CM

## 2022-11-20 DIAGNOSIS — O3680X0 Pregnancy with inconclusive fetal viability, not applicable or unspecified: Secondary | ICD-10-CM

## 2022-11-20 DIAGNOSIS — Z789 Other specified health status: Secondary | ICD-10-CM

## 2022-11-20 NOTE — Telephone Encounter (Signed)
Called pt. She still hasn't gotten any labs done for pregnancy. Last u/s 10/25/22 and no IUP seen. At this point, will do another u/s. Will f/u with results. Discussed importance of her getting imaging done and she understands.

## 2022-11-21 ENCOUNTER — Ambulatory Visit: Admission: RE | Admit: 2022-11-21 | Payer: Medicaid Other | Source: Ambulatory Visit

## 2022-11-27 ENCOUNTER — Encounter: Payer: Self-pay | Admitting: Obstetrics and Gynecology

## 2022-11-27 NOTE — Progress Notes (Signed)
PT IS PREGNANT WITH UNKNOWN LOCATION OF PREGNANCY. Has been contacted extensively to encourage labs and then f/u OB u/s. Pt has no showed for all of these. I explained to her the importance of knowing the pregnancy location and potential harm to her if ectopic. Pt voiced understanding but still did not show for OB u/s.

## 2022-11-27 NOTE — Telephone Encounter (Signed)
U/S rescheduled for 12/07/22

## 2022-11-28 ENCOUNTER — Emergency Department
Admission: EM | Admit: 2022-11-28 | Discharge: 2022-11-28 | Disposition: A | Discharge status: Home / Self Care | Payer: Medicaid Other | Attending: Emergency Medicine | Admitting: Emergency Medicine

## 2022-11-28 ENCOUNTER — Encounter: Payer: Self-pay | Admitting: Licensed Practical Nurse

## 2022-11-28 ENCOUNTER — Other Ambulatory Visit: Payer: Self-pay

## 2022-11-28 ENCOUNTER — Emergency Department: Payer: Medicaid Other

## 2022-11-28 DIAGNOSIS — I10 Essential (primary) hypertension: Secondary | ICD-10-CM | POA: Diagnosis not present

## 2022-11-28 DIAGNOSIS — J45909 Unspecified asthma, uncomplicated: Secondary | ICD-10-CM | POA: Diagnosis not present

## 2022-11-28 DIAGNOSIS — O219 Vomiting of pregnancy, unspecified: Secondary | ICD-10-CM | POA: Diagnosis present

## 2022-11-28 DIAGNOSIS — Z3A09 9 weeks gestation of pregnancy: Secondary | ICD-10-CM | POA: Insufficient documentation

## 2022-11-28 LAB — HEPATIC FUNCTION PANEL
ALT: 15 U/L (ref 0–44)
AST: 14 U/L — ABNORMAL LOW (ref 15–41)
Albumin: 3.4 g/dL — ABNORMAL LOW (ref 3.5–5.0)
Alkaline Phosphatase: 22 U/L — ABNORMAL LOW (ref 38–126)
Bilirubin, Direct: <0.1 mg/dL (ref 0.0–0.2)
Total Bilirubin: 0.3 mg/dL (ref 0.3–1.2)
Total Protein: 6.9 g/dL (ref 6.5–8.1)

## 2022-11-28 LAB — BASIC METABOLIC PANEL
Anion gap: 9 (ref 5–15)
BUN: 16 mg/dL (ref 6–20)
CO2: 20 mmol/L — ABNORMAL LOW (ref 22–32)
Calcium: 8.8 mg/dL — ABNORMAL LOW (ref 8.9–10.3)
Chloride: 105 mmol/L (ref 98–111)
Creatinine, Ser: 0.52 mg/dL (ref 0.44–1.00)
GFR, Estimated: >60 mL/min (ref >60)
Glucose, Bld: 94 mg/dL (ref 70–99)
Potassium: 3.6 mmol/L (ref 3.5–5.1)
Sodium: 134 mmol/L — ABNORMAL LOW (ref 135–145)

## 2022-11-28 LAB — CBC
HCT: 34.5 % — ABNORMAL LOW (ref 36.0–46.0)
Hemoglobin: 11.6 g/dL — ABNORMAL LOW (ref 12.0–15.0)
MCH: 29.4 pg (ref 26.0–34.0)
MCHC: 33.6 g/dL (ref 30.0–36.0)
MCV: 87.6 fL (ref 80.0–100.0)
Platelets: 222 10*3/uL (ref 150–400)
RBC: 3.94 MIL/uL (ref 3.87–5.11)
RDW: 12.7 % (ref 11.5–15.5)
WBC: 7.2 10*3/uL (ref 4.0–10.5)
nRBC: 0 % (ref 0.0–0.2)

## 2022-11-28 LAB — HCG, QUANTITATIVE, PREGNANCY: hCG, Beta Chain, Quant, S: 143105 m[IU]/mL — ABNORMAL HIGH (ref <5)

## 2022-11-28 LAB — LIPASE, BLOOD: Lipase: 53 U/L — ABNORMAL HIGH (ref 11–51)

## 2022-11-28 MED ORDER — ONDANSETRON 4 MG PO TBDP: 4.0000 mg | ORAL_TABLET | Freq: Once | ORAL | Status: DC

## 2022-11-28 MED ORDER — METOCLOPRAMIDE HCL 5 MG/ML IJ SOLN
10.0000 mg | Freq: Once | INTRAMUSCULAR | Status: AC
  Administered 2022-11-28: 10 mg via INTRAVENOUS
  Filled 2022-11-28: qty 2

## 2022-11-28 MED ORDER — SODIUM CHLORIDE 0.9 % IV BOLUS (SEPSIS)
1000.0000 mL | Freq: Once | INTRAVENOUS | Status: AC
  Administered 2022-11-28: 1000 mL via INTRAVENOUS

## 2022-11-28 MED ORDER — DOXYLAMINE-PYRIDOXINE 10-10 MG PO TBEC: 2.0000 | DELAYED_RELEASE_TABLET | Freq: Every evening | ORAL | 0 refills | Status: DC | PRN

## 2022-11-28 NOTE — Progress Notes (Signed)
Pt seen in ED on 3/19 for nausea. Contacted by ED provider to make [place] aware that an [place] was done that shows SIUP at [phone], a subchorionic hemorrhage was seen.  Pt will be instructed to follow up with OB outpatient.  Roberto Scales, [facility]  11/28/22  9:04 AM

## 2022-11-28 NOTE — ED Notes (Signed)
Pt educated on the need for a UA sample, pt refuses to try at this time.

## 2022-11-28 NOTE — ED Triage Notes (Signed)
EMS brings pt in from home for c/o N/V x hr

## 2022-11-28 NOTE — ED Notes (Signed)
D/C, f/up, new RX discussed with pt, pt verbalized understanding NAD noted. Pt ambulatory with steady gait on D/C. NAD noted.

## 2022-11-28 NOTE — Discharge Instructions (Addendum)
Take Diclegis as prescribed for nausea and vomiting during pregnancy.  Take at nighttime since this medication may make you sleepy.  You are about [redacted] weeks pregnant on your ultrasound.  The fetus is in the right place.  There was a small subchorionic bleed that was found on your ultrasound that you need to follow-up with gynecologist to recheck.  Follow-up with your gynecologist this week for checkup.  Thank you for choosing [place] for your health care today!  Please see your primary doctor this week for a follow up appointment.   Sometimes, in the early stages of certain disease courses it is difficult to detect in the emergency department evaluation -- so, it is important that you continue to monitor your symptoms and call your doctor right away or return to the emergency department if you develop any new or worsening symptoms.  Please go to the following website to schedule new (and existing) patient appointments:   [email or web address]  If you do not have a primary doctor try calling the following clinics to establish care:  If you have insurance:  [facility] [id number] [place] [address]   [facility]  [phone] [address] [place] [address]   If you do not have insurance:  [facility]  [phone] [address] [place] [address]   The following is another list of primary care offices in the area who are accepting new patients at this time.  Please reach out to one of them directly and let them know you would like to schedule an appointment to follow up on an Emergency Department visit, and/or to establish a new primary care provider (PCP).  There are likely other primary care clinics in the are who are accepting new patients, but this is an excellent place to start:  [place] physician: Dr Lavon Paganini [address] [place], [place]  [address] [phone]  [facility] Lead Physician: Dr Steele Sizer [address], [place], [place] [address] [phone]  [place] Physician: Dr Park Liter [address] [place], [place] [address] [phone]  [facility] Lead Physician: Dr Dewaine Oats [place], [place], [place] [address] [phone]  [facility] Physician: Dr Halina Maidens [address] [place], [place] [address] [phone]   It was my pleasure to care for you today.   [place] Jacelyn Grip, MD

## 2022-11-28 NOTE — ED Provider Notes (Addendum)
[facility] Provider Note    Event Date/Time   First MD Initiated Contact with Patient 11/28/22 [phone]     (approximate)   History   Emesis   HPI  Katrina Sherman is a 22 y.o. female   Past medical history of anxiety, asthma, hypertension currently pregnant last menstrual period in January who is established care with gynecology [facility] in [place] not yet received ultrasound despite attempts to schedule who presents to the emergency department with nausea and vomiting since this morning.  She denies abdominal pain, vaginal bleeding, vaginal discharge, urinary symptoms, fevers or chills.  She was feeling well yesterday.  No known sick contacts or recent travel.   Denies any other acute medical complaints.  She has been pregnant in the past and has 1 daughter, she states uncomplicated pregnancy.   External Medical Documents Reviewed: Gynecology note from October 23, 2022 with positive pregnancy test and plan for follow-up at 8 weeks, ultrasound at 10 weeks and then a progress note from 11/27/2022 noting that the patient has not yet completed an ultrasound due to several no-shows for appointments      Physical Exam   Triage Vital Signs: ED Triage Vitals  Enc Vitals Group     BP 11/28/22 0421 99/74     Pulse Rate 11/28/22 0421 (!) 56     Resp 11/28/22 0421 15     Temp 11/28/22 0421 98.8 F (37.1 C)     Temp Source 11/28/22 0421 Oral     SpO2 11/28/22 0421 97 %     Weight 11/28/22 0418 214 lb 15.9 oz (97.5 kg)     Height --      Head Circumference --      Peak Flow --      Pain Score 11/28/22 0418 0     Pain Loc --      Pain Edu? --      Excl. in [place]? --     Most recent vital signs: Vitals:   11/28/22 0700 11/28/22 0800  BP: 111/62 115/69  Pulse: 61 86  Resp: 19 17  Temp:  98.8 F (37.1 C)  SpO2: 98% 100%    General: Awake, no distress.  CV:  Good peripheral perfusion.  Resp:  Normal effort.  Abd:  No distention.   Other:  Awake alert vital signs normal, pleasant patient.  Afebrile.  Abdomen is soft and nontender to palpation all quadrants.  Skin appears warm and well-perfused.   ED Results / Procedures / Treatments   Labs (all labs ordered are listed, but only abnormal results are displayed) Labs Reviewed  CBC - Abnormal; Notable for the following components:      Result Value   Hemoglobin 11.6 (*)    HCT 34.5 (*)    All other components within normal limits  BASIC METABOLIC PANEL - Abnormal; Notable for the following components:   Sodium 134 (*)    CO2 20 (*)    Calcium 8.8 (*)    All other components within normal limits  LIPASE, BLOOD - Abnormal; Notable for the following components:   Lipase 53 (*)    All other components within normal limits  HCG, QUANTITATIVE, PREGNANCY - Abnormal; Notable for the following components:   hCG, Beta Chain, Quant, S 143,105 (*)    All other components within normal limits  HEPATIC FUNCTION PANEL - Abnormal; Notable for the following components:   Albumin 3.4 (*)    AST 14 (*)  Alkaline Phosphatase 22 (*)    All other components within normal limits     I ordered and reviewed the above labs they are notable for her creatinine is 0.52 which is consistent with prior testing and her H&H is stable from prior testing as well    RADIOLOGY I independently reviewed and interpreted pelvic ultrasound and I see a intrauterine pregnancy   PROCEDURES:  Critical Care performed: No  Procedures   MEDICATIONS ORDERED IN ED: Medications  sodium chloride 0.9 % bolus 1,000 mL (0 mLs Intravenous Stopped 11/28/22 0810)  metoCLOPramide (REGLAN) injection 10 mg (10 mg Intravenous Given 11/28/22 0650)   Consultant I spoke with Roberto Scales CNM of [facility] at [facility] to discuss the results of the transvaginal ultrasound including single live intrauterine pregnancy approximately 9 weeks as well as a small subchorionic hemorrhage for which she will  follow-up.  IMPRESSION / MDM / ASSESSMENT AND PLAN / ED COURSE  I reviewed the triage vital signs and the nursing notes.                                Patient's presentation is most consistent with acute presentation with potential threat to life or bodily function.  Differential diagnosis includes, but is not limited to, ectopic pregnancy, electrolyte disturbance, dehydration, hyperemesis gravidarum   The patient is on the cardiac monitor to evaluate for evidence of arrhythmia and/or significant heart rate changes.  MDM: Patient with nausea and vomiting in early pregnancy.  She has a nontender abdomen and has no vaginal bleeding no other acute medical complaints.  She feels well after getting some fluids and Reglan no longer nauseated.  Several no-shows with her gynecologist and they have been trying to find her to perform an ultrasound I will have that performed today to locate the pregnancy and rule out ectopic pregnancy.  Her labs are within normal limits.  I considered hospitalization for admission or observation however given that the patient's symptoms are well-controlled now with fluids and Reglan, labs within normal limits, establish care with gynecology, I think outpatient monitoring and follow-up is most appropriate at this time pending ultrasound if unremarkable.  Single live intrauterine pregnancy on ultrasound as well as a small subchorionic hemorrhage.  Patient is stable.  I discussed with her OB/GYN who will follow-up in clinic regarding these results.  She was unable to produce a urine sample, I wanted to screen for asymptomatic bacteriuria in pregnancy, the patient has no urinary symptoms.  She knows to follow-up with her gynecology to get this testing done later this week.        FINAL CLINICAL IMPRESSION(S) / ED DIAGNOSES   Final diagnoses:  Nausea and vomiting in pregnancy     Rx / DC Orders   ED Discharge Orders          Ordered     Doxylamine-Pyridoxine (DICLEGIS) 10-10 MG TBEC  At bedtime PRN        11/28/22 0809             Note:  This document was prepared using Dragon voice recognition software and may include unintentional dictation errors.    Lucillie Garfinkel, MD 11/28/22 [id number]    Lucillie Garfinkel, MD 11/28/22 [phone]

## 2022-11-28 NOTE — ED Notes (Addendum)
US at bedside

## 2022-11-28 NOTE — ED Notes (Signed)
Rounded on patient. Patient denies needs at this time. Patient supine on stretcher with respirations even and non labored, patient without signs of distress. Patient denies needs at this time.

## 2022-11-28 NOTE — ED Triage Notes (Signed)
Pt presents to ER via ems from home with c/o n/v and feeling light headed for last hour.  Pt denies abd pain, diarrhea, or vaginal bleeding.  Pt did have positive preg test 2/12.  Pt states she has not been able to eat or drink much lately.  Pt sleepy in triage, and only answering "yes" or "no."  Pt does not appear in any distress on arrival.

## 2022-12-01 ENCOUNTER — Ambulatory Visit (INDEPENDENT_AMBULATORY_CARE_PROVIDER_SITE_OTHER): Payer: Medicaid Other

## 2022-12-01 DIAGNOSIS — Z369 Encounter for antenatal screening, unspecified: Secondary | ICD-10-CM

## 2022-12-01 DIAGNOSIS — Z3480 Encounter for supervision of other normal pregnancy, unspecified trimester: Secondary | ICD-10-CM

## 2022-12-01 DIAGNOSIS — Z130 Encounter for screening for diseases of the blood and blood-forming organs and certain disorders involving the immune mechanism: Secondary | ICD-10-CM

## 2022-12-01 DIAGNOSIS — Z3689 Encounter for other specified antenatal screening: Secondary | ICD-10-CM

## 2022-12-01 NOTE — Progress Notes (Unsigned)
New OB Intake  I connected with  Katrina Sherman on 12/01/22 at  1:15 PM EDT by telephone and verified that I am speaking with the correct person using two identifiers. Nurse is located at [employer] and pt is located at car.  I explained I am completing New OB Intake today. We discussed her EDD of 06/30/2023 that is based on U/S of [redacted]w[redacted]d. Pt is G2/P1001. Pt isn't sure she will keep this baby.  I reviewed her allergies, medications, Medical/Surgical/OB history, and appropriate screenings. There are cats in the home no.  Based on history, this is a/an pregnancy uncomplicated .   Patient Active Problem List   Diagnosis Date Noted   Supervision of other normal pregnancy, antepartum 12/01/2022   Trichomoniasis 09/20/2022   Supervision of high risk pregnancy, antepartum 06/28/2020   Encounter for supervision of high risk pregnancy due to fetal anomaly, second trimester 06/28/2020   Pregnancy 05/01/2020    Concerns addressed today None  Delivery Plans:  Plans to deliver at [facility].  Not sure as she isn't sure she is going to keep the baby or not.  Anatomy US Explained first scheduled [place] will be 3/28th and an anatomy scan will be done at 20 weeks.  LabsNater Discussed genetic screening with patient. Patient declines genetic testing.  Discussed possible labs to be drawn at new OB appointment.  COVID Vaccine Patient has not had COVID vaccine.   Social Determinants of Health Food Insecurity: denies food insecurity  Transportation: Patient denies transportation needs. Childcare: Discussed no children allowed at ultrasound appointments.   First visit review I reviewed new OB appt with pt. I explained she will have ob bloodwork and pap smear/pelvic exam if indicated. Explained pt will be seen by Lloyd Huger, CNM at first visit; encounter routed to appropriate provider.   Cleophas Dunker, [place] 12/01/2022  1:37 PM

## 2022-12-04 NOTE — Progress Notes (Signed)
Sent my chart message for the patient to call to scheduled.

## 2022-12-05 NOTE — Patient Instructions (Signed)
First Trimester of Pregnancy  The first trimester of pregnancy starts on the first day of your last menstrual period until the end of week 12. This is also called months 1 through 3 of pregnancy. Body changes during your first trimester Your body goes through many changes during pregnancy. The changes usually return to normal after your baby is born. Physical changes You may gain or lose weight. Your breasts may grow larger and hurt. The area around your nipples may get darker. Dark spots or blotches may develop on your face. You may have changes in your hair. Health changes You may feel like you might vomit (nauseous), and you may vomit. You may have heartburn. You may have headaches. You may have trouble pooping (constipation). Your gums may bleed. Other changes You may get tired easily. You may pee (urinate) more often. Your menstrual periods will stop. You may not feel hungry. You may want to eat certain kinds of food. You may have changes in your emotions from day to day. You may have more dreams. Follow these instructions at home: Medicines Take over-the-counter and prescription medicines only as told by your doctor. Some medicines are not safe during pregnancy. Take a prenatal vitamin that contains at least 600 micrograms (mcg) of folic acid. Eating and drinking Eat healthy meals that include: Fresh fruits and vegetables. Whole grains. Good sources of protein, such as meat, eggs, or tofu. Low-fat dairy products. Avoid raw meat and unpasteurized juice, milk, and cheese. If you feel like you may vomit, or you vomit: Eat 4 or 5 small meals a day instead of 3 large meals. Try eating a few soda crackers. Drink liquids between meals instead of during meals. You may need to take these actions to prevent or treat trouble pooping: Drink enough fluids to keep your pee (urine) pale yellow. Eat foods that are high in fiber. These include beans, whole grains, and fresh fruits and  vegetables. Limit foods that are high in fat and sugar. These include fried or sweet foods. Activity Exercise only as told by your doctor. Most people can do their usual exercise routine during pregnancy. Stop exercising if you have cramps or pain in your lower belly (abdomen) or low back. Do not exercise if it is too hot or too humid, or if you are in a place of great height (high altitude). Avoid heavy lifting. If you choose to, you may have sex unless your doctor tells you not to. Relieving pain and discomfort Wear a good support bra if your breasts are sore. Rest with your legs raised (elevated) if you have leg cramps or low back pain. If you have bulging veins (varicose veins) in your legs: Wear support hose as told by your doctor. Raise your feet for 15 minutes, 3-4 times a day. Limit salt in your food. Safety Wear your seat belt at all times when you are in a car. Talk with your doctor if someone is hurting you or yelling at you. Talk with your doctor if you are feeling sad or have thoughts of hurting yourself. Lifestyle Do not use hot tubs, steam rooms, or saunas. Do not douche. Do not use tampons or scented sanitary pads. Do not use herbal medicines, illegal drugs, or medicines that are not approved by your doctor. Do not drink alcohol. Do not smoke or use any products that contain nicotine or tobacco. If you need help quitting, ask your doctor. Avoid cat litter boxes and soil that is used by cats. These carry   germs that can cause harm to the baby and can cause a loss of your baby by miscarriage or stillbirth. General instructions Keep all follow-up visits. This is important. Ask for help if you need counseling or if you need help with nutrition. Your doctor can give you advice or tell you where to go for help. Visit your dentist. At home, brush your teeth with a soft toothbrush. Floss gently. Write down your questions. Take them to your prenatal visits. Where to find more  information American Pregnancy Association: americanpregnancy.org American College of Obstetricians and Gynecologists: www.acog.org Office on Women's Health: womenshealth.gov/pregnancy Contact a doctor if: You are dizzy. You have a fever. You have mild cramps or pressure in your lower belly. You have a nagging pain in your belly area. You continue to feel like you may vomit, you vomit, or you have watery poop (diarrhea) for 24 hours or longer. You have a bad-smelling fluid coming from your vagina. You have pain when you pee. You are exposed to a disease that spreads from person to person, such as chickenpox, measles, Zika virus, HIV, or hepatitis. Get help right away if: You have spotting or bleeding from your vagina. You have very bad belly cramping or pain. You have shortness of breath or chest pain. You have any kind of injury, such as from a fall or a car crash. You have new or increased pain, swelling, or redness in an arm or leg. Summary The first trimester of pregnancy starts on the first day of your last menstrual period until the end of week 12 (months 1 through 3). Eat 4 or 5 small meals a day instead of 3 large meals. Do not smoke or use any products that contain nicotine or tobacco. If you need help quitting, ask your doctor. Keep all follow-up visits. This information is not intended to replace advice given to you by your health care provider. Make sure you discuss any questions you have with your health care provider. Document Revised: 02/04/2020 Document Reviewed: 12/11/2019 Elsevier Patient Education  2023 Elsevier Inc. Commonly Asked Questions During Pregnancy  Cats: A parasite can be excreted in cat feces.  To avoid exposure you need to have another person empty the little box.  If you must empty the litter box you will need to wear gloves.  Wash your hands after handling your cat.  This parasite can also be found in raw or undercooked meat so this should also be  avoided.  Colds, Sore Throats, Flu: Please check your medication sheet to see what you can take for symptoms.  If your symptoms are unrelieved by these medications please call the office.  Dental Work: Most any dental work your dentist recommends is permitted.  X-rays should only be taken during the first trimester if absolutely necessary.  Your abdomen should be shielded with a lead apron during all x-rays.  Please notify your provider prior to receiving any x-rays.  Novocaine is fine; gas is not recommended.  If your dentist requires a note from us prior to dental work please call the office and we will provide one for you.  Exercise: Exercise is an important part of staying healthy during your pregnancy.  You may continue most exercises you were accustomed to prior to pregnancy.  Later in your pregnancy you will most likely notice you have difficulty with activities requiring balance like riding a bicycle.  It is important that you listen to your body and avoid activities that put you at a higher   risk of falling.  Adequate rest and staying well hydrated are a must!  If you have questions about the safety of specific activities ask your provider.    Exposure to Children with illness: Try to avoid obvious exposure; report any symptoms to us when noted,  If you have chicken pos, red measles or mumps, you should be immune to these diseases.   Please do not take any vaccines while pregnant unless you have checked with your OB provider.  Fetal Movement: After 28 weeks we recommend you do "kick counts" twice daily.  Lie or sit down in a calm quiet environment and count your baby movements "kicks".  You should feel your baby at least 10 times per hour.  If you have not felt 10 kicks within the first hour get up, walk around and have something sweet to eat or drink then repeat for an additional hour.  If count remains less than 10 per hour notify your provider.  Fumigating: Follow your pest control agent's  advice as to how long to stay out of your home.  Ventilate the area well before re-entering.  Hemorrhoids:   Most over-the-counter preparations can be used during pregnancy.  Check your medication to see what is safe to use.  It is important to use a stool softener or fiber in your diet and to drink lots of liquids.  If hemorrhoids seem to be getting worse please call the office.   Hot Tubs:  Hot tubs Jacuzzis and saunas are not recommended while pregnant.  These increase your internal body temperature and should be avoided.  Intercourse:  Sexual intercourse is safe during pregnancy as long as you are comfortable, unless otherwise advised by your provider.  Spotting may occur after intercourse; report any bright red bleeding that is heavier than spotting.  Labor:  If you know that you are in labor, please go to the hospital.  If you are unsure, please call the office and let us help you decide what to do.  Lifting, straining, etc:  If your job requires heavy lifting or straining please check with your provider for any limitations.  Generally, you should not lift items heavier than that you can lift simply with your hands and arms (no back muscles)  Painting:  Paint fumes do not harm your pregnancy, but may make you ill and should be avoided if possible.  Latex or water based paints have less odor than oils.  Use adequate ventilation while painting.  Permanents & Hair Color:  Chemicals in hair dyes are not recommended as they cause increase hair dryness which can increase hair loss during pregnancy.  " Highlighting" and permanents are allowed.  Dye may be absorbed differently and permanents may not hold as well during pregnancy.  Sunbathing:  Use a sunscreen, as skin burns easily during pregnancy.  Drink plenty of fluids; avoid over heating.  Tanning Beds:  Because their possible side effects are still unknown, tanning beds are not recommended.  Ultrasound Scans:  Routine ultrasounds are performed  at approximately 20 weeks.  You will be able to see your baby's general anatomy an if you would like to know the gender this can usually be determined as well.  If it is questionable when you conceived you may also receive an ultrasound early in your pregnancy for dating purposes.  Otherwise ultrasound exams are not routinely performed unless there is a medical necessity.  Although you can request a scan we ask that you pay for it when   conducted because insurance does not cover " patient request" scans.  Work: If your pregnancy proceeds without complications you may work until your due date, unless your physician or employer advises otherwise.  Round Ligament Pain/Pelvic Discomfort:  Sharp, shooting pains not associated with bleeding are fairly common, usually occurring in the second trimester of pregnancy.  They tend to be worse when standing up or when you remain standing for long periods of time.  These are the result of pressure of certain pelvic ligaments called "round ligaments".  Rest, Tylenol and heat seem to be the most effective relief.  As the womb and fetus grow, they rise out of the pelvis and the discomfort improves.  Please notify the office if your pain seems different than that described.  It may represent a more serious condition.  Common Medications Safe in Pregnancy  Acne:      Constipation:  Benzoyl Peroxide     Colace  Clindamycin      Dulcolax Suppository  Topica Erythromycin     Fibercon  Salicylic Acid      Metamucil         Miralax AVOID:        Senakot   Accutane    Cough:  Retin-A       Cough Drops  Tetracycline      Phenergan w/ Codeine if Rx  Minocycline      Robitussin (Plain & DM)  Antibiotics:     Crabs/Lice:  Ceclor       RID  Cephalosporins    AVOID:  E-Mycins      Kwell  Keflex  Macrobid/Macrodantin   Diarrhea:  Penicillin      Kao-Pectate  Zithromax      Imodium AD         PUSH FLUIDS AVOID:       Cipro     Fever:  Tetracycline      Tylenol (Regular  or Extra  Minocycline       Strength)  Levaquin      Extra Strength-Do not          Exceed 8 tabs/24 hrs Caffeine:        <200mg/day (equiv. To 1 cup of coffee or  approx. 3 12 oz sodas)         Gas: Cold/Hayfever:       Gas-X  Benadryl      Mylicon  Claritin       Phazyme  **Claritin-D        Chlor-Trimeton    Headaches:  Dimetapp      ASA-Free Excedrin  Drixoral-Non-Drowsy     Cold Compress  Mucinex (Guaifenasin)     Tylenol (Regular or Extra  Sudafed/Sudafed-12 Hour     Strength)  **Sudafed PE Pseudoephedrine   Tylenol Cold & Sinus     Vicks Vapor Rub  Zyrtec  **AVOID if Problems With Blood Pressure         Heartburn: Avoid lying down for at least 1 hour after meals  Aciphex      Maalox     Rash:  Milk of Magnesia     Benadryl    Mylanta       1% Hydrocortisone Cream  Pepcid  Pepcid Complete   Sleep Aids:  Prevacid      Ambien   Prilosec       Benadryl  Rolaids       Chamomile Tea  Tums (Limit 4/day)     Unisom           Tylenol PM         Warm milk-add vanilla or  Hemorrhoids:       Sugar for taste  Anusol/Anusol H.C.  (RX: Analapram 2.5%)  Sugar Substitutes:  Hydrocortisone OTC     Ok in moderation  Preparation H      Tucks        Vaseline lotion applied to tissue with wiping    Herpes:     Throat:  Acyclovir      Oragel  Famvir  Valtrex     Vaccines:         Flu Shot Leg Cramps:       *Gardasil  Benadryl      Hepatitis A         Hepatitis B Nasal Spray:       Pneumovax  Saline Nasal Spray     Polio Booster         Tetanus Nausea:       Tuberculosis test or PPD  Vitamin B6 25 mg TID   AVOID:    Dramamine      *Gardasil  Emetrol       Live Poliovirus  Ginger Root 250 mg QID    MMR (measles, mumps &  High Complex Carbs @ Bedtime    rebella)  Sea Bands-Accupressure    Varicella (Chickenpox)  Unisom 1/2 tab TID     *No known complications           If received before Pain:         Known pregnancy;   Darvocet       Resume series  after  Lortab        Delivery  Percocet    Yeast:   Tramadol      Femstat  Tylenol 3      Gyne-lotrimin  Ultram       Monistat  Vicodin           MISC:         All Sunscreens           Hair Coloring/highlights          Insect Repellant's          (Including DEET)         Mystic Tans  

## 2022-12-05 NOTE — Progress Notes (Signed)
The patient has seen my chart message to contact the office for scheduling. The patient hasn't reach out for scheduling.

## 2022-12-06 NOTE — Telephone Encounter (Signed)
Per pt will call back at her convenience to scheduled NOB phys due to her child having prior scheduled appointments

## 2022-12-06 NOTE — Telephone Encounter (Signed)
Per pt will call back at her convenience to schedule NOB phys due to her child having prior scheduled appointments

## 2022-12-06 NOTE — Telephone Encounter (Signed)
I contacted this patient about needing to scheduled NOB appointment. I left generic message for the patient to call back for scheduling.

## 2022-12-07 ENCOUNTER — Ambulatory Visit: Payer: Medicaid Other

## 2022-12-12 ENCOUNTER — Other Ambulatory Visit: Payer: Medicaid Other

## 2022-12-12 ENCOUNTER — Telehealth: Payer: Self-pay | Admitting: Obstetrics and Gynecology

## 2022-12-12 NOTE — Telephone Encounter (Signed)
Reached out to pt to reschedule NOB labs.  Pt did not want to reschedule at this time.

## 2022-12-20 NOTE — Telephone Encounter (Addendum)
I contacted the patient to scheduled NOB lab and NOB phys currently scheduled for 4/17 for lab and 4/30 for NOB phys with Erskine Squibb. I advised the patient that she wouldn't needing to schedule Ultrasound because there was imaging done on 3/19. The patient reports she was told she needing additonal imaging due to a hematoma. Please advise? I have the patient scheduled per the patient's request for Afternoon only appointments. She also asked to call her back after 9 am.

## 2022-12-20 NOTE — Telephone Encounter (Signed)
They will manage the hemorrhage u/s f/u at her NOB appt.

## 2022-12-20 NOTE — Telephone Encounter (Signed)
I contacted the patient via phone. I left voicemail for the patient to call back to confirm scheduled NOB labs and NOB phys. Per [facility] they will order ultrasound follow up if needed and at her NOB appointment.

## 2022-12-23 ENCOUNTER — Emergency Department
Admission: EM | Admit: 2022-12-23 | Discharge: 2022-12-23 | Discharge status: Against Medical Advice | Payer: Medicaid Other | Attending: Emergency Medicine | Admitting: Emergency Medicine

## 2022-12-23 ENCOUNTER — Other Ambulatory Visit: Payer: Self-pay

## 2022-12-23 DIAGNOSIS — W268XXA Contact with other sharp object(s), not elsewhere classified, initial encounter: Secondary | ICD-10-CM | POA: Diagnosis not present

## 2022-12-23 DIAGNOSIS — O9A219 Injury, poisoning and certain other consequences of external causes complicating pregnancy, unspecified trimester: Secondary | ICD-10-CM | POA: Diagnosis not present

## 2022-12-23 DIAGNOSIS — S61214A Laceration without foreign body of right ring finger without damage to nail, initial encounter: Secondary | ICD-10-CM | POA: Insufficient documentation

## 2022-12-23 DIAGNOSIS — Z5321 Procedure and treatment not carried out due to patient leaving prior to being seen by health care provider: Secondary | ICD-10-CM | POA: Insufficient documentation
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
## 2022-12-23 NOTE — ED Notes (Signed)
Patient reports she is done waiting to  be seen and is leaving, can be seen at [facility] tomorrow.  Patient left without signing AMA paperwork.

## 2022-12-27 ENCOUNTER — Other Ambulatory Visit: Payer: Medicaid Other

## 2022-12-28 NOTE — Telephone Encounter (Signed)
That's all we can do. She is non-compliant.

## 2022-12-28 NOTE — Telephone Encounter (Signed)
I contacted the patient via phone. The patient never contacted the office confirm scheduling for lab, nor NOB phys. Mulitple attempt to reach this patient have been unsuccessful. I have this patient schedule for NOB 4/30 with [facility]. Please advise?

## 2023-01-03 NOTE — Telephone Encounter (Signed)
I contacted the patient via phone. I advised per Dr. Valentino Saxon that we would need her to establish care for her visit coming that labs and if imaging needed to be done we could offer at her visit on 4/30.  I tried confirming the appointment, while on the phone with the patient. But she was hoarse and I asked if she could repeat what she said. She said she would call back, that she is in the shower. When the patient called back please confirm scheduled appointment.

## 2023-01-09 ENCOUNTER — Encounter: Payer: Medicaid Other | Admitting: Advanced Practice Midwife

## 2023-01-09 ENCOUNTER — Telehealth: Payer: Self-pay | Admitting: Advanced Practice Midwife

## 2023-01-09 ENCOUNTER — Other Ambulatory Visit: Payer: Medicaid Other

## 2023-01-09 ENCOUNTER — Other Ambulatory Visit: Payer: Self-pay

## 2023-01-09 DIAGNOSIS — Z3480 Encounter for supervision of other normal pregnancy, unspecified trimester: Secondary | ICD-10-CM

## 2023-01-09 NOTE — Telephone Encounter (Signed)
Patient came in at 3:40 for a appointment that was supposed to have been at 3:20pm. Then when I told her I said hun you missed your New OB appt at 2:55 with provider and she said her and Dr.Cherry has been messaging back and forth and patient said Valentino Saxon told her the New OB appt was canceled. Which wasn't true. So I told the patient since she was her we can still get her in for the labs. And patient said NO if she can't get a u/s to see her baby she not doing nothing. So I told her let's do the labs today and then we can get you rs for the New Ob visit. Then patient said that's ok she's not going to do labs and don't need the appt because they just did labs at the hospital. Patient said she will just go to the hospital and she will get a u/s that way and she will receive her care at [facility].

## 2023-01-10 ENCOUNTER — Other Ambulatory Visit: Payer: Self-pay

## 2023-01-10 ENCOUNTER — Emergency Department
Admission: EM | Admit: 2023-01-10 | Discharge: 2023-01-10 | Disposition: A | Discharge status: Home / Self Care | Payer: Medicaid Other | Attending: Emergency Medicine | Admitting: Emergency Medicine

## 2023-01-10 ENCOUNTER — Emergency Department: Payer: Medicaid Other

## 2023-01-10 ENCOUNTER — Encounter: Payer: Self-pay | Admitting: Emergency Medicine

## 2023-01-10 DIAGNOSIS — R1032 Left lower quadrant pain: Secondary | ICD-10-CM | POA: Diagnosis not present

## 2023-01-10 DIAGNOSIS — O26892 Other specified pregnancy related conditions, second trimester: Secondary | ICD-10-CM | POA: Insufficient documentation

## 2023-01-10 DIAGNOSIS — Z3A16 16 weeks gestation of pregnancy: Secondary | ICD-10-CM | POA: Insufficient documentation

## 2023-01-10 DIAGNOSIS — Z5329 Procedure and treatment not carried out because of patient's decision for other reasons: Secondary | ICD-10-CM | POA: Diagnosis not present

## 2023-01-10 DIAGNOSIS — O4290 Premature rupture of membranes, unspecified as to length of time between rupture and onset of labor, unspecified weeks of gestation: Secondary | ICD-10-CM

## 2023-01-10 LAB — CBC
HCT: 32 % — ABNORMAL LOW (ref 36.0–46.0)
Hemoglobin: 10.9 g/dL — ABNORMAL LOW (ref 12.0–15.0)
MCH: 29.4 pg (ref 26.0–34.0)
MCHC: 34.1 g/dL (ref 30.0–36.0)
MCV: 86.3 fL (ref 80.0–100.0)
Platelets: 259 10*3/uL (ref 150–400)
RBC: 3.71 MIL/uL — ABNORMAL LOW (ref 3.87–5.11)
RDW: 12.3 % (ref 11.5–15.5)
WBC: 7.1 10*3/uL (ref 4.0–10.5)
nRBC: 0 % (ref 0.0–0.2)

## 2023-01-10 LAB — COMPREHENSIVE METABOLIC PANEL
ALT: 22 U/L (ref 0–44)
AST: 27 U/L (ref 15–41)
Albumin: 2.9 g/dL — ABNORMAL LOW (ref 3.5–5.0)
Alkaline Phosphatase: 48 U/L (ref 38–126)
Anion gap: 11 (ref 5–15)
BUN: 9 mg/dL (ref 6–20)
CO2: 18 mmol/L — ABNORMAL LOW (ref 22–32)
Calcium: 8.8 mg/dL — ABNORMAL LOW (ref 8.9–10.3)
Chloride: 105 mmol/L (ref 98–111)
Creatinine, Ser: 0.44 mg/dL (ref 0.44–1.00)
GFR, Estimated: >60 mL/min (ref >60)
Glucose, Bld: 107 mg/dL — ABNORMAL HIGH (ref 70–99)
Potassium: 2.9 mmol/L — ABNORMAL LOW (ref 3.5–5.1)
Sodium: 134 mmol/L — ABNORMAL LOW (ref 135–145)
Total Bilirubin: 0.6 mg/dL (ref 0.3–1.2)
Total Protein: 6.7 g/dL (ref 6.5–8.1)

## 2023-01-10 LAB — HCG, QUANTITATIVE, PREGNANCY: hCG, Beta Chain, Quant, S: 34851 m[IU]/mL — ABNORMAL HIGH (ref <5)

## 2023-01-10 LAB — ABO/RH: ABO/RH(D): B POS

## 2023-01-10 LAB — LIPASE, BLOOD: Lipase: 32 U/L (ref 11–51)

## 2023-01-10 MED ORDER — POTASSIUM CHLORIDE CRYS ER 20 MEQ PO TBCR
40.0000 meq | EXTENDED_RELEASE_TABLET | Freq: Once | ORAL | Status: AC
  Administered 2023-01-10: 40 meq via ORAL
  Filled 2023-01-10: qty 2

## 2023-01-10 NOTE — ED Provider Notes (Signed)
[facility] Provider Note    Event Date/Time   First MD Initiated Contact with Patient 01/10/23 1221     (approximate)   History   Abdominal Pain   HPI  Katrina Sherman is a 22 y.o. female G2/P1 presents to the emergency department with complaints of lower abdominal cramping and a clear watery discharge.  Patient states that she knows she has a subchorionic hematoma.  Went to see the OB/GYN and was told at the front desk that they do not do that imaging because it is not their responsibility.  Patient did not speak to the doctor.  Patient states she is concerned because she thinks she is leaking amniotic fluid.  Denies fever or chills.  Patient's blood type is B-.  Has not had her RhoGAM injection yet either      Physical Exam   Triage Vital Signs: ED Triage Vitals  Enc Vitals Group     BP 01/10/23 1121 117/77     Pulse Rate 01/10/23 1121 88     Resp 01/10/23 1121 18     Temp 01/10/23 1121 98.5 F (36.9 C)     Temp Source 01/10/23 1121 Oral     SpO2 01/10/23 1121 97 %     Weight --      Height --      Head Circumference --      Peak Flow --      Pain Score 01/10/23 1122 6     Pain Loc --      Pain Edu? --      Excl. in GC? --     Most recent vital signs: Vitals:   01/10/23 1121  BP: 117/77  Pulse: 88  Resp: 18  Temp: 98.5 F (36.9 C)  SpO2: 97%     General: Awake, no distress.   CV:  Good peripheral perfusion. regular rate and  rhythm Resp:  Normal effort.  Abd:  No distention.   Other:  Lower abdomen tender to palpation   ED Results / Procedures / Treatments   Labs (all labs ordered are listed, but only abnormal results are displayed) Labs Reviewed  COMPREHENSIVE METABOLIC PANEL - Abnormal; Notable for the following components:      Result Value   Sodium 134 (*)    Potassium 2.9 (*)    CO2 18 (*)    Glucose, Bld 107 (*)    Calcium 8.8 (*)    Albumin 2.9 (*)    All other components within normal limits  CBC - Abnormal;  Notable for the following components:   RBC 3.71 (*)    Hemoglobin 10.9 (*)    HCT 32.0 (*)    All other components within normal limits  HCG, QUANTITATIVE, PREGNANCY - Abnormal; Notable for the following components:   hCG, Beta Chain, Quant, S 34,851 (*)    All other components within normal limits  LIPASE, BLOOD  POC URINE PREG, ED  ABO/RH     EKG     RADIOLOGY Ultrasound OB greater than 14 weeks    PROCEDURES:   Procedures   MEDICATIONS ORDERED IN ED: Medications  potassium chloride SA (KLOR-CON M) CR tablet 40 mEq (40 mEq Oral Given 01/10/23 1436)     IMPRESSION / MDM / ASSESSMENT AND PLAN / ED COURSE  I reviewed the triage vital signs and the nursing notes.  Differential diagnosis includes, but is not limited to, miscarriage, threatened miscarriage, subchorionic hemorrhage, round ligament pain  Patient's presentation is most consistent with acute presentation with potential threat to life or bodily function.   Labs and imaging ordered  Calcium is little decreased at 2.9 will give the patient potassium here in the ED, her blood type is B+ she does not need RhoGAM.  Patient was given K-Lor 40 mEq p.o.  Ultrasound OB greater than 14 weeks, I did review the radiologist reading and said normal amniotic fluid, gestation at 16 weeks, heartbeat noted  The patient signed out AMA.  She did not want to stay and wait for her results.  She was of sound mind at this time.  She was nonintoxicated.  She just was impatient and wanted to go pick up her children.   FINAL CLINICAL IMPRESSION(S) / ED DIAGNOSES   Final diagnoses:  Left lower quadrant abdominal pain affecting pregnancy in second trimester     Rx / DC Orders   ED Discharge Orders     None        Note:  This document was prepared using Dragon voice recognition software and may include unintentional dictation errors.    Faythe Ghee, PA-C 01/10/23 1450    Jene Every, MD 01/10/23 [phone]

## 2023-01-10 NOTE — ED Notes (Signed)
Ice chips given to patient.  

## 2023-01-10 NOTE — ED Triage Notes (Addendum)
Patient to ED for lower abd pain that started yesterday. Patient states she is [redacted] weeks pregnant and has a "subchorionic hematoma." Was told to come back for repeat imaging due to not being about to get it at [facility]. States she has been leaking some amniotic fluid when sneezing or coughing.

## 2023-01-31 ENCOUNTER — Emergency Department
Admission: EM | Admit: 2023-01-31 | Discharge: 2023-01-31 | Discharge status: Against Medical Advice | Payer: Medicaid Other | Attending: Emergency Medicine | Admitting: Emergency Medicine

## 2023-01-31 ENCOUNTER — Other Ambulatory Visit: Payer: Self-pay

## 2023-01-31 DIAGNOSIS — Z5329 Procedure and treatment not carried out because of patient's decision for other reasons: Secondary | ICD-10-CM | POA: Diagnosis not present

## 2023-01-31 DIAGNOSIS — Z3A17 17 weeks gestation of pregnancy: Secondary | ICD-10-CM | POA: Insufficient documentation

## 2023-01-31 DIAGNOSIS — O26892 Other specified pregnancy related conditions, second trimester: Secondary | ICD-10-CM | POA: Insufficient documentation

## 2023-01-31 DIAGNOSIS — Z202 Contact with and (suspected) exposure to infections with a predominantly sexual mode of transmission: Secondary | ICD-10-CM | POA: Insufficient documentation

## 2023-01-31 LAB — URINALYSIS, ROUTINE W REFLEX MICROSCOPIC
Bilirubin Urine: NEGATIVE
Glucose, UA: NEGATIVE mg/dL
Hgb urine dipstick: NEGATIVE
Ketones, ur: NEGATIVE mg/dL
Nitrite: NEGATIVE
Protein, ur: NEGATIVE mg/dL
Specific Gravity, Urine: 1.026 (ref 1.005–1.030)
pH: 6 (ref 5.0–8.0)

## 2023-01-31 LAB — CHLAMYDIA/NGC RT PCR (ARMC ONLY)
Chlamydia Tr: NOT DETECTED
N gonorrhoeae: NOT DETECTED

## 2023-01-31 LAB — PREGNANCY, URINE: Preg Test, Ur: POSITIVE — AB

## 2023-01-31 NOTE — ED Provider Notes (Signed)
[facility] Provider Note    Event Date/Time   First MD Initiated Contact with Patient 01/31/23 1903     (approximate)   History   Exposure to STD   HPI  Katrina Sherman is a 22 y.o. female who presents today with request for testing for chlamydia.  Patient reports that she was exposed to chlamydia by her partner, though she is not experiencing any symptoms.  Patient is unsure when the exposure occurred.  Patient reports that she is pregnant.  No vaginal discharge or bleeding.  Patient Active Problem List   Diagnosis Date Noted   Supervision of other normal pregnancy, antepartum 12/01/2022   Trichomoniasis 09/20/2022   Supervision of high risk pregnancy, antepartum 06/28/2020   Encounter for supervision of high risk pregnancy due to fetal anomaly, second trimester 06/28/2020   Pregnancy 05/01/2020          Physical Exam   Triage Vital Signs: ED Triage Vitals  Enc Vitals Group     BP 01/31/23 1809 121/74     Pulse Rate 01/31/23 1809 69     Resp 01/31/23 1809 16     Temp 01/31/23 1809 98.8 F (37.1 C)     Temp src --      SpO2 01/31/23 1809 97 %     Weight 01/31/23 1810 210 lb (95.3 kg)     Height 01/31/23 1810 5' 3.5" (1.613 m)     Head Circumference --      Peak Flow --      Pain Score 01/31/23 1810 0     Pain Loc --      Pain Edu? --      Excl. in GC? --     Most recent vital signs: Vitals:   01/31/23 1809  BP: 121/74  Pulse: 69  Resp: 16  Temp: 98.8 F (37.1 C)  SpO2: 97%    Physical Exam Vitals and nursing note reviewed.  Constitutional:      General: Awake and alert. No acute distress.    Appearance: Normal appearance. The patient is normal weight.  HENT:     Head: Normocephalic and atraumatic.     Mouth: Mucous membranes are moist.  Eyes:     General: PERRL. Normal EOMs        Right eye: No discharge.        Left eye: No discharge.     Conjunctiva/sclera: Conjunctivae normal.  Cardiovascular:     Rate and Rhythm:  Normal rate and regular rhythm.     Pulses: Normal pulses.  Pulmonary:     Effort: Pulmonary effort is normal. No respiratory distress.     Breath sounds: Normal breath sounds.  Abdominal:     Abdomen is soft. There is no abdominal tenderness. No rebound or guarding. No distention. Musculoskeletal:        General: No swelling. Normal range of motion.     Cervical back: Normal range of motion and neck supple.  Skin:    General: Skin is warm and dry.     Capillary Refill: Capillary refill takes less than 2 seconds.     Findings: No rash.  Neurological:     Mental Status: The patient is awake and alert.      ED Results / Procedures / Treatments   Labs (all labs ordered are listed, but only abnormal results are displayed) Labs Reviewed  URINALYSIS, ROUTINE W REFLEX MICROSCOPIC - Abnormal; Notable for the following components:  Result Value   Color, Urine YELLOW (*)    APPearance HAZY (*)    Leukocytes,Ua SMALL (*)    Bacteria, UA RARE (*)    All other components within normal limits  PREGNANCY, URINE - Abnormal; Notable for the following components:   Preg Test, Ur POSITIVE (*)    All other components within normal limits  CHLAMYDIA/NGC RT PCR ([facility] ONLY)               EKG     RADIOLOGY     PROCEDURES:  Critical Care performed:   Procedures   MEDICATIONS ORDERED IN ED: Medications - No data to display   IMPRESSION / MDM / ASSESSMENT AND PLAN / ED COURSE  I reviewed the triage vital signs and the nursing notes.   Differential diagnosis includes, but is not limited to, STD, UTI, Candida, PID.  Patient is awake and alert, hemodynamically stable and afebrile.  She is non toxic in appearance. Her abdomen is soft and nontender.  She agreed to [facility] chlamydia, wet prep, urinalysis, and pregnancy and further workup.  However, patient reportedly eloped prior to complete testing or results.  RN attempted to call her without success. She did not notify anyone prior  to departure.   Patient's presentation is most consistent with acute complicated illness / injury requiring diagnostic workup.      FINAL CLINICAL IMPRESSION(S) / ED DIAGNOSES   Final diagnoses:  Possible exposure to STD     Rx / DC Orders   ED Discharge Orders     None        Note:  This document was prepared using Dragon voice recognition software and may include unintentional dictation errors.   Keturah Shavers 01/31/23 2327    Merwyn Katos, MD 02/01/23 [phone]

## 2023-01-31 NOTE — ED Triage Notes (Signed)
Pt to ED for STD exposure. States partner was exposed to chlamydia. denies sx/  [redacted] weeks pregnant.

## 2023-02-15 ENCOUNTER — Ambulatory Visit: Payer: Medicaid Other | Admitting: Family Medicine

## 2023-02-15 ENCOUNTER — Encounter: Payer: Self-pay | Admitting: Family Medicine

## 2023-02-15 DIAGNOSIS — Z113 Encounter for screening for infections with a predominantly sexual mode of transmission: Secondary | ICD-10-CM | POA: Diagnosis not present

## 2023-02-15 DIAGNOSIS — Z202 Contact with and (suspected) exposure to infections with a predominantly sexual mode of transmission: Secondary | ICD-10-CM

## 2023-02-15 DIAGNOSIS — A599 Trichomoniasis, unspecified: Secondary | ICD-10-CM

## 2023-02-15 LAB — WET PREP FOR TRICH, YEAST, CLUE
Trichomonas Exam: POSITIVE — AB
Yeast Exam: NEGATIVE

## 2023-02-15 LAB — HM HIV SCREENING LAB: HM HIV Screening: NEGATIVE

## 2023-02-15 MED ORDER — PENICILLIN G BENZATHINE 1200000 UNIT/2ML IM SUSY
2.4000 10*6.[IU] | PREFILLED_SYRINGE | Freq: Once | INTRAMUSCULAR | Status: AC
Start: 2023-02-15 — End: 2023-02-15
  Administered 2023-02-15: 2.4 10*6.[IU] via INTRAMUSCULAR

## 2023-02-15 MED ORDER — METRONIDAZOLE 500 MG PO TABS: 500.0000 mg | ORAL_TABLET | Freq: Two times a day (BID) | ORAL | 0 refills | Status: DC

## 2023-02-15 NOTE — Addendum Note (Signed)
Addended by: Lenice Llamas on: 02/15/2023 11:53 AM   Modules accepted: Orders

## 2023-02-15 NOTE — Progress Notes (Signed)
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  Wet mount results reviewed.  The patient was dispensed Metronidazole 500 mg #14 today. I provided counseling today regarding the medication. We discussed the medication, the side effects and when to call clinic. Patient given the opportunity to ask questions. Questions answered.  Bicillin 2.4 MU given IM without any complications.  Pt monitored for 15 minutes.  Berdie Ogren, RN

## 2023-02-15 NOTE — Progress Notes (Signed)
[facility] Department  STI clinic/screening visit [address] [place] [address] [phone]  Subjective:  Katrina Sherman is a 22 y.o. female being seen today for an STI screening visit. The patient reports they do have symptoms.  Patient reports that they do desire a pregnancy in the next year.   They reported they are not interested in discussing contraception today.    No LMP recorded (lmp unknown). Patient is pregnant.  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 Reports rash under left breast and between thighs that has been present for 3 weeks. Reports sore on the roof of her mouth that has since healed- states it was painful when it appeared. Reports thick vaginal discharge   Does the patient using douching products? No  Last HIV test per patient/review of record was No results found for: "HMHIVSCREEN"  Lab Results  Component Value Date   HIV Non Reactive 06/28/2020   Patient reports last pap was  Lab Results  Component Value Date   DIAGPAP  11/24/2021    - Negative for intraepithelial lesion or malignancy (NILM)   No results found for: "SPECADGYN"  Screening for MPX risk: Does the patient have an unexplained rash? No Is the patient MSM? No Does the patient endorse multiple sex partners or anonymous sex partners? No Did the patient have close or sexual contact with a person diagnosed with MPX? No Has the patient traveled outside the [place] where MPX is endemic?  No Is there a high clinical suspicion for MPX-- evidenced by one of the following No  -Unlikely to be chickenpox  -Lymphadenopathy  -Rash that present in same phase of evolution on any given body part See flowsheet for further details and programmatic requirements.   Immunization history:   There is no immunization history on file for this patient.   The following portions of the patient's history were reviewed and updated as appropriate: allergies, current medications, past medical history, past social history, past surgical history and problem list.  Objective:  There were no vitals filed for this visit.  Physical Exam Vitals and nursing note reviewed.  Constitutional:      Appearance: Normal appearance.  HENT:     Head: Normocephalic and atraumatic.     Mouth/Throat:     Mouth: Mucous membranes are moist.     Pharynx: Oropharynx is clear. No oropharyngeal exudate or posterior oropharyngeal erythema.  Pulmonary:     Effort: Pulmonary effort is normal.  Abdominal:     General: Abdomen is flat.     Palpations: There is no mass.     Tenderness: There is no abdominal tenderness. There is no rebound.  Genitourinary:    General: Normal vulva.     Exam position: Lithotomy position.     Pubic Area: No rash or pubic lice.      Labia:        Right: No rash or lesion.        Left: No rash or lesion.  Vagina: Vaginal discharge present. No erythema, bleeding or lesions.     Cervix: No cervical motion tenderness, discharge, friability, lesion or erythema.     Uterus: Normal.      Adnexa: Right adnexa normal and left adnexa normal.     Rectum: Normal.     Comments: pH = 4  White frothy discharge present Lymphadenopathy:     Head:     Right side of head: No preauricular or posterior auricular adenopathy.     Left side of head: No preauricular or posterior auricular adenopathy.     Cervical: No cervical adenopathy.     Upper Body:     Right upper body: No supraclavicular,  axillary or epitrochlear adenopathy.     Left upper body: No supraclavicular, axillary or epitrochlear adenopathy.     Lower Body: No right inguinal adenopathy. No left inguinal adenopathy.  Skin:    General: Skin is warm and dry.     Findings: No rash.          Comments: Redness under left breast  -rash between thighs appears to be irritation and does not appear like syphilis rash  Neurological:     Mental Status: She is alert and oriented to person, place, and time.      Assessment and Plan:  Katrina Sherman is a 22 y.o. 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 Counseled regarding seriousness of syphilis, especially during pregnancy. Reviewed that without proper treatment- patient's fetus can die. Counseled NO sex until after partner is treated and at least 7 days have passed  - penicillin g benzathine (BICILLIN LA) 1200000 UNIT/2ML injection 2.4 Million Units - Syphilis Serology, [facility]  2. Screening for venereal disease  - Chlamydia/Gonorrhea [facility] - HIV [place] [facility] - Syphilis Serology, [facility] - WET PREP FOR TRICH, YEAST, CLUE   Patient accepted all screenings including oral, vaginal CT/GC and bloodwork for HIV/RPR, and wet prep. Patient meets criteria for HepB screening? No. Ordered? not applicable Patient meets criteria for HepC screening? No. Ordered? not applicable  Treat wet prep per standing order Discussed time line for [facility] results and that patient will be called with positive results and encouraged patient to call if she had not heard in 2 weeks.  Counseled to return or seek care for continued or worsening symptoms Recommended repeat testing in 3 months with positive results. Recommended condom use with all sex    Return if symptoms worsen or fail to improve, for STI screening.  No future  appointments. Total time spent 30 minutes  Lenice Llamas, [place]

## 2023-02-19 ENCOUNTER — Encounter: Payer: Self-pay | Admitting: Obstetrics and Gynecology

## 2023-02-19 ENCOUNTER — Observation Stay: Payer: Medicaid Other

## 2023-02-19 ENCOUNTER — Observation Stay
Admission: EM | Admit: 2023-02-19 | Discharge: 2023-02-19 | Disposition: A | Discharge status: Home / Self Care | Payer: Medicaid Other | Attending: Obstetrics and Gynecology | Admitting: Obstetrics and Gynecology

## 2023-02-19 ENCOUNTER — Other Ambulatory Visit: Payer: Self-pay

## 2023-02-19 DIAGNOSIS — O99512 Diseases of the respiratory system complicating pregnancy, second trimester: Secondary | ICD-10-CM | POA: Insufficient documentation

## 2023-02-19 DIAGNOSIS — Z79899 Other long term (current) drug therapy: Secondary | ICD-10-CM | POA: Insufficient documentation

## 2023-02-19 DIAGNOSIS — Z3A21 21 weeks gestation of pregnancy: Secondary | ICD-10-CM | POA: Diagnosis not present

## 2023-02-19 DIAGNOSIS — J45909 Unspecified asthma, uncomplicated: Secondary | ICD-10-CM | POA: Insufficient documentation

## 2023-02-19 DIAGNOSIS — O4690 Antepartum hemorrhage, unspecified, unspecified trimester: Secondary | ICD-10-CM | POA: Diagnosis present

## 2023-02-19 DIAGNOSIS — O10012 Pre-existing essential hypertension complicating pregnancy, second trimester: Secondary | ICD-10-CM | POA: Insufficient documentation

## 2023-02-19 DIAGNOSIS — O4702 False labor before 37 completed weeks of gestation, second trimester: Principal | ICD-10-CM | POA: Insufficient documentation

## 2023-02-19 DIAGNOSIS — Z3480 Encounter for supervision of other normal pregnancy, unspecified trimester: Secondary | ICD-10-CM

## 2023-02-19 LAB — WET PREP, GENITAL
Sperm: NONE SEEN
Trich, Wet Prep: NONE SEEN
WBC, Wet Prep HPF POC: >=10 — AB (ref <10)
Yeast Wet Prep HPF POC: NONE SEEN

## 2023-02-19 LAB — CHLAMYDIA/NGC RT PCR (ARMC ONLY)
Chlamydia Tr: NOT DETECTED
N gonorrhoeae: NOT DETECTED

## 2023-02-19 MED ORDER — CALCIUM CARBONATE ANTACID 500 MG PO CHEW: 2.0000 | CHEWABLE_TABLET | Freq: Four times a day (QID) | ORAL | Status: AC | PRN

## 2023-02-19 MED ORDER — CALCIUM CARBONATE ANTACID 500 MG PO CHEW: 2.0000 | CHEWABLE_TABLET | ORAL | Status: DC | PRN

## 2023-02-19 MED ORDER — METRONIDAZOLE 500 MG PO TABS: 500.0000 mg | ORAL_TABLET | Freq: Two times a day (BID) | ORAL | 0 refills | Status: AC

## 2023-02-19 MED ORDER — METRONIDAZOLE 500 MG PO TABS
500.0000 mg | ORAL_TABLET | Freq: Two times a day (BID) | ORAL | Status: DC
  Filled 2023-02-19: qty 1

## 2023-02-19 MED ORDER — ACETAMINOPHEN 325 MG PO TABS: 650.0000 mg | ORAL_TABLET | Freq: Four times a day (QID) | ORAL | Status: AC | PRN

## 2023-02-19 MED ORDER — ACETAMINOPHEN 325 MG PO TABS: 650.0000 mg | ORAL_TABLET | ORAL | Status: DC | PRN

## 2023-02-19 NOTE — Discharge Summary (Signed)
Katrina Sherman is a 22 y.o. female. She is at [redacted]w[redacted]d gestation. No LMP recorded (lmp unknown). Patient is pregnant. Estimated Date of Delivery: 06/30/23  Prenatal care site: [facility]   Current pregnancy complicated by:  - insufficient prenatal care, has only been seen once at 18wks - Obesity - Syphilis, being treated currently  Chief complaint: vaginal bleeding  She reports on 02/14/23 she was at work and got hit in the abdomen with a heavy bag. She experienced bleeding and filled a pad with blood on 02/14/23. She has continued spotting every day since except for today. She denies cramping or pain. She reports that she is starting to feel fetal movement.  S: Resting comfortably. no CTX, no VB.no LOF,  Active fetal movement.  Denies: HA, visual changes, SOB, or RUQ/epigastric pain  Maternal Medical History:   Past Medical History:  Diagnosis Date   Anxiety    Asthma    Hypertension     Past Surgical History:  Procedure Laterality Date   NO PAST SURGERIES      Allergies  Allergen Reactions   Kiwi Extract     Other reaction(s): Unknown   Other     Other reaction(s): Unknown DUST MITES   Pollen Extract     Other reaction(s): Unknown    Prior to Admission medications   Medication Sig Start Date End Date Taking? Authorizing Provider  metroNIDAZOLE (FLAGYL) 500 MG tablet Take 1 tablet (500 mg total) by mouth 2 (two) times daily for 7 days. 02/15/23 02/22/23 Yes Lenice Llamas, FNP  acetaminophen (TYLENOL) 325 MG tablet Take 2 tablets (650 mg total) by mouth every 6 (six) hours as needed for mild pain or fever (for pain scale < 4  OR  temperature  >/=  100.5 F). 02/19/23   Janyce Llanos, CNM  calcium carbonate (TUMS - DOSED IN MG ELEMENTAL CALCIUM) 500 MG chewable tablet Chew 2 tablets (400 mg of elemental calcium total) by mouth 4 (four) times daily as needed for indigestion. 02/19/23   Janyce Llanos, CNM  cetirizine (ZYRTEC ALLERGY) 10 MG tablet Take  1 tablet (10 mg total) by mouth daily for 14 days. 11/19/19 12/03/19  Miguel Aschoff., MD  cyclobenzaprine (FLEXERIL) 10 MG tablet Take 1 tablet (10 mg total) by mouth 3 (three) times daily as needed for muscle spasms. Patient not taking: Reported on 02/15/2023 03/14/21   Nita Sickle, MD  diclofenac (VOLTAREN) 75 MG EC tablet Take 1 tablet by mouth 2 (two) times daily with a meal. Patient not taking: Reported on 02/15/2023    [provider]  diphenhydrAMINE (BENADRYL ALLERGY) 25 mg capsule Take 1 capsule (25 mg total) by mouth every 6 (six) hours as needed for itching or allergies. 11/19/19   Miguel Aschoff., MD  Doxylamine-Pyridoxine (DICLEGIS) 10-10 MG TBEC Take 2 tablets by mouth at bedtime as needed. Patient not taking: Reported on 02/15/2023 11/28/22   Pilar Jarvis, MD  fluticasone [facility]) 50 MCG/ACT nasal spray Place 1 spray into both nostrils daily.    [provider]  metroNIDAZOLE (FLAGYL) 500 MG tablet Take 2 tabs BID for 1 day Patient not taking: Reported on 12/01/2022 11/16/22   Copland, Ilona Sorrel, PA-C  metroNIDAZOLE (FLAGYL) 500 MG tablet Take 1 tablet (500 mg total) by mouth 2 (two) times daily for 7 days. 02/19/23 02/26/23  Janyce Llanos, CNM  PROAIR HFA 108 [phone] Base) MCG/ACT inhaler Inhale 2 puffs into the lungs as needed. 06/03/20   [provider]  traMADol (ULTRAM) 50 MG tablet Take 1 tablet by mouth every 6 (six) hours as needed. Patient not taking: Reported on 02/19/2023    [provider]  triamcinolone cream (KENALOG) 0.1 % Apply 1 application topically 2 (two) times daily. Patient not taking: Reported on 02/19/2023 04/29/20   [provider]  famotidine (PEPCID) 20 MG tablet Take 1 tablet (20 mg total) by mouth 2 (two) times daily for 5 days. 05/17/19 09/24/19  Enid Derry, PA-C    Social History: She  reports that she has never smoked. She has never used smokeless tobacco. She reports that she does not drink alcohol and does not  use drugs.  Family History: family history includes Breast cancer in her maternal grandmother; Healthy in her maternal grandfather, mother, paternal grandfather, and paternal grandmother.  no history of gyn cancers  Review of Systems: A full review of systems was performed and negative except as noted in the HPI.     O:  BP 106/69 (BP Location: Left Arm)   Pulse 78   Temp 98.4 F (36.9 C) (Oral)   Resp 16   Ht 5' 3.5" (1.613 m)   Wt 95.3 kg   LMP  (LMP Unknown)   BMI 36.62 kg/m  Results for orders placed or performed during the hospital encounter of 02/19/23 (from the past 48 hour(s))  Wet prep, genital   Collection Time: 02/19/23 10:31 AM   Specimen: Cervical/Vaginal swab  Result Value Ref Range   Yeast Wet Prep HPF POC NONE SEEN NONE SEEN   Trich, Wet Prep NONE SEEN NONE SEEN   Clue Cells Wet Prep HPF POC PRESENT (A) NONE SEEN   WBC, Wet Prep HPF POC >=10 (A) <10   Sperm NONE SEEN   Chlamydia/NGC rt PCR ([facility] only)   Collection Time: 02/19/23 10:31 AM   Specimen: Cervical/Vaginal swab; GU  Result Value Ref Range   Specimen source GC/Chlam ENDOCERVICAL    Chlamydia Tr NOT DETECTED NOT DETECTED   N gonorrhoeae NOT DETECTED NOT DETECTED     OB US: CLINICAL DATA:  Vaginal bleeding   EXAM: LIMITED OBSTETRIC ULTRASOUND   COMPARISON:  01/10/2023   FINDINGS: Number of Fetuses: 1   Heart Rate:  141 bpm   Movement: Yes   Presentation: Breech   Placental Location: Anterior   Previa: No   Amniotic Fluid (Subjective):  Within normal limits.   AFI: 5.9 cm   BPD: 5.1 cm 21 w  3 d   MATERNAL FINDINGS:   Cervix:  Appears closed.   Uterus/Adnexae: No abnormality visualized.   IMPRESSION: Single live intrauterine pregnancy as described above.   This exam is performed on an emergent basis and does not comprehensively evaluate fetal size, dating, or anatomy; follow-up complete OB US should be considered if further fetal assessment  is warranted.  Constitutional: NAD, AAOx3  HE/ENT: extraocular movements grossly intact, moist mucous membranes CV: RRR PULM: nl respiratory effort, CTABL     Abd: gravid, non-tender, non-distended, soft      Ext: Non-tender, Nonedematous   Psych: mood appropriate, speech normal Pelvic: she declined speculum exam  Pelvic exam: normal external genitalia, vulva, vagina, cervix, uterus and adnexa.  Fetal  monitoring: too early for NST FHT 140-145bpm  A/P: 22 y.o. [redacted]w[redacted]d here for antenatal surveillance for vaginal bleeding in pregnancy  Principle Diagnosis:  Vaginal bleeding in pregnancy  Vaginal bleeding: BV present, will treat with Metronidazole. This may be the cause of bleeding. No evidence of placental abruption. Cervix appears  closed on ultrasound, patient declines speculum exam. Reviewed bleeding precautions and when to return. She verbalized understanding.  Labor: not present.  Fetal Wellbeing: Reassuring  Patient does not have a follow-up prenatal visit scheduled. Stressed the importance of prenatal care and having an anatomy US completed. Will have a [facility] scheduler call her to schedule anatomy US and prenatal visit.  D/c home stable, precautions reviewed, follow-up as scheduled.    Janyce Llanos, CNM 02/19/2023 12:55 PM

## 2023-02-19 NOTE — OB Triage Note (Signed)
22 y.o G2P1 presents to L&D triage at [redacted]w[redacted]d reporting vaginal bleeding after being hit in the abdomen on accident at work with a heavy bag. Pt reports fetal movement this morning and says that the bleeding stopped today. She reports having a current syphilis infection from her partner and says she's received IM penicillin. Pt is still unsure of whether she's keeping the baby or giving it up for adoption. Pt is currently undergoing an [place] and vaginal swabs having been performed. Fetal heart tones detected in the 130's-140's. Wilson CNM aware of pt arrival and is awaiting results.

## 2023-03-16 ENCOUNTER — Other Ambulatory Visit: Payer: Self-pay | Admitting: Certified Nurse Midwife

## 2023-03-16 ENCOUNTER — Telehealth: Payer: Self-pay

## 2023-03-16 DIAGNOSIS — O99210 Obesity complicating pregnancy, unspecified trimester: Secondary | ICD-10-CM

## 2023-04-11 ENCOUNTER — Ambulatory Visit: Payer: Medicaid Other

## 2023-04-11 NOTE — Progress Notes (Deleted)
Pt was a "No Show" for her MFM appt on 04/11/23.  Next appt scheduled for 05/07/23 @ [facility] bc she would not go to [facility] for ultrasound.

## 2023-05-07 ENCOUNTER — Other Ambulatory Visit: Payer: Self-pay

## 2023-05-07 ENCOUNTER — Ambulatory Visit: Payer: Medicaid Other | Attending: Obstetrics and Gynecology

## 2023-05-07 VITALS — BP 105/64 | HR 74 | Temp 98.7°F | Ht 63.0 in | Wt 236.5 lb

## 2023-05-07 DIAGNOSIS — O99213 Obesity complicating pregnancy, third trimester: Secondary | ICD-10-CM | POA: Diagnosis not present

## 2023-05-07 DIAGNOSIS — E669 Obesity, unspecified: Secondary | ICD-10-CM | POA: Diagnosis not present

## 2023-05-07 DIAGNOSIS — O99210 Obesity complicating pregnancy, unspecified trimester: Secondary | ICD-10-CM | POA: Diagnosis present

## 2023-05-07 DIAGNOSIS — O0933 Supervision of pregnancy with insufficient antenatal care, third trimester: Secondary | ICD-10-CM | POA: Diagnosis not present

## 2023-05-07 DIAGNOSIS — Z363 Encounter for antenatal screening for malformations: Secondary | ICD-10-CM | POA: Insufficient documentation

## 2023-05-07 DIAGNOSIS — Z3A32 32 weeks gestation of pregnancy: Secondary | ICD-10-CM | POA: Insufficient documentation

## 2023-05-07 DIAGNOSIS — O09293 Supervision of pregnancy with other poor reproductive or obstetric history, third trimester: Secondary | ICD-10-CM | POA: Diagnosis not present

## 2023-05-07 DIAGNOSIS — O321XX0 Maternal care for breech presentation, not applicable or unspecified: Secondary | ICD-10-CM | POA: Insufficient documentation

## 2023-05-07 DIAGNOSIS — Z3480 Encounter for supervision of other normal pregnancy, unspecified trimester: Secondary | ICD-10-CM

## 2023-05-24 ENCOUNTER — Encounter: Payer: Self-pay | Admitting: Obstetrics and Gynecology

## 2023-06-06 ENCOUNTER — Other Ambulatory Visit: Payer: Self-pay | Admitting: Obstetrics and Gynecology

## 2023-06-06 DIAGNOSIS — O99213 Obesity complicating pregnancy, third trimester: Secondary | ICD-10-CM

## 2023-06-11 ENCOUNTER — Ambulatory Visit: Payer: Medicaid Other

## 2023-06-15 ENCOUNTER — Ambulatory Visit: Payer: Medicaid Other | Admitting: Family Medicine

## 2023-06-15 DIAGNOSIS — A599 Trichomoniasis, unspecified: Secondary | ICD-10-CM

## 2023-06-15 DIAGNOSIS — Z113 Encounter for screening for infections with a predominantly sexual mode of transmission: Secondary | ICD-10-CM | POA: Diagnosis not present

## 2023-06-15 MED ORDER — METRONIDAZOLE 500 MG PO TABS: 500.0000 mg | ORAL_TABLET | Freq: Two times a day (BID) | ORAL | Status: AC

## 2023-06-15 NOTE — Progress Notes (Signed)
[facility] Department  STI clinic/screening visit [address] [place] [address] [phone]  Subjective:  Katrina Sherman is a 22 y.o. female being seen today for an STI screening visit. The patient reports they do have symptoms.  Patient reports that they  are currently pregnant  desire a pregnancy in the next year.   They reported they are not interested in discussing contraception today.    No LMP recorded (lmp unknown). Patient is pregnant.  Patient has the following medical conditions:   Patient Active Problem List   Diagnosis Date Noted   Vaginal bleeding in pregnancy 02/19/2023   Supervision of other normal pregnancy, antepartum 12/01/2022   Trichomoniasis 09/20/2022   Supervision of high risk pregnancy, antepartum 06/28/2020   Encounter for supervision of high risk pregnancy due to fetal anomaly, second trimester 06/28/2020   Pregnancy 05/01/2020    Chief Complaint  Patient presents with   SEXUALLY TRANSMITTED DISEASE    STI medication    HPI  Patient reports  to clinic asking to be re-treated for Trich. Pt was seen in June as a contact to syphilis. She tested positive for trich then, and I treated her with metronizadole 500mg  BID for 7 days. Pt walked into the HD today and states she lost her meds. She declines having additional testing, and declines exam.  From chart review- appears patient has not received medical care during this pregnancy. She reports she is being seen by [facility], and was referred there by [facility].  These documents typically flow into our EPIC system. She reports she had an appointment on 06/11/23, but she missed it and states that [facility] MFM told her the next appointment is 10/24. She reports her due date is 10/19. She asks if we can fit her in here for an appointment.  I reviewed this case with our medical director, who states that this patient needs to return to [facility] if that is where her care was started. I reviewed this with the  patient, phone number verified, I encouraged patient to call [facility]. I strongly encouraged patient to take medication to treat her trich.   Last HIV test per patient/review of record was  Lab Results  Component Value Date   HMHIVSCREEN Negative - Validated 02/15/2023    Lab Results  Component Value Date   HIV Non Reactive 06/28/2020     Last HEPC test per patient/review of record was No results found for: "HMHEPCSCREEN" No components found for: "HEPC"   Last HEPB test per patient/review of record was No components found for: "HMHEPBSCREEN" No components found for: "HEPC"   Patient reports last pap was  Lab Results  Component Value Date   DIAGPAP  11/24/2021    - Negative for intraepithelial lesion or malignancy (NILM)   No results found for: "SPECADGYN"  Screening for MPX risk: Does the patient have an unexplained rash? No Is the patient MSM? No Does the patient endorse multiple sex partners or anonymous sex partners? No Did the patient have close or sexual contact with a person diagnosed with MPX? No Has the patient traveled outside the [place] where MPX is endemic? No Is there a high clinical suspicion for MPX-- evidenced by one of the following No  -Unlikely to be chickenpox  -Lymphadenopathy  -Rash that present in same phase of evolution on any given body part See flowsheet for further details and programmatic requirements.   Immunization history:   There is no immunization history on file for  this patient.   The following portions of the patient's history were reviewed and updated as appropriate: allergies, current medications, past medical history, past social history, past surgical history and problem list.  Objective:  There were no vitals filed for this visit.  Physical Exam Declined exam.  Assessment and Plan:  Katrina Sherman is a 22 y.o. female presenting to the [facility] Department for STI screening  1. Trichomoniasis  - metroNIDAZOLE (FLAGYL) 500 MG  tablet; Take 1 tablet (500 mg total) by mouth 2 (two) times daily for 7 days.   Declined all testing today.  Treat wet prep per standing order Discussed time line for State Lab results and that patient will be called with positive results and encouraged patient to call if she had not heard in 2 weeks.  Counseled to return or seek care for continued or worsening symptoms Recommended repeat testing in 3 months with positive results. Recommended condom use with all sex  Patient is currently pregnant.    Return if symptoms worsen or fail to improve.  No future appointments.  Lenice Llamas, [place]

## 2023-07-09 ENCOUNTER — Other Ambulatory Visit: Payer: Self-pay

## 2023-07-09 ENCOUNTER — Encounter: Payer: Self-pay | Admitting: Obstetrics and Gynecology

## 2023-07-09 ENCOUNTER — Inpatient Hospital Stay
Admission: EM | Admit: 2023-07-09 | Discharge: 2023-07-10 | DRG: 806 | Disposition: A | Discharge status: Home / Self Care | Payer: Medicaid Other | Attending: Certified Nurse Midwife | Admitting: Certified Nurse Midwife

## 2023-07-09 DIAGNOSIS — O99214 Obesity complicating childbirth: Secondary | ICD-10-CM | POA: Diagnosis present

## 2023-07-09 DIAGNOSIS — Z23 Encounter for immunization: Secondary | ICD-10-CM

## 2023-07-09 DIAGNOSIS — O9081 Anemia of the puerperium: Secondary | ICD-10-CM | POA: Diagnosis not present

## 2023-07-09 DIAGNOSIS — J45909 Unspecified asthma, uncomplicated: Secondary | ICD-10-CM | POA: Diagnosis present

## 2023-07-09 DIAGNOSIS — O99210 Obesity complicating pregnancy, unspecified trimester: Secondary | ICD-10-CM | POA: Diagnosis present

## 2023-07-09 DIAGNOSIS — Z91018 Allergy to other foods: Secondary | ICD-10-CM

## 2023-07-09 DIAGNOSIS — Z91048 Other nonmedicinal substance allergy status: Secondary | ICD-10-CM | POA: Diagnosis not present

## 2023-07-09 DIAGNOSIS — O480 Post-term pregnancy: Principal | ICD-10-CM | POA: Diagnosis present

## 2023-07-09 DIAGNOSIS — Z8774 Personal history of (corrected) congenital malformations of heart and circulatory system: Secondary | ICD-10-CM

## 2023-07-09 DIAGNOSIS — Z3A41 41 weeks gestation of pregnancy: Secondary | ICD-10-CM | POA: Diagnosis not present

## 2023-07-09 DIAGNOSIS — Z803 Family history of malignant neoplasm of breast: Secondary | ICD-10-CM

## 2023-07-09 DIAGNOSIS — O479 False labor, unspecified: Secondary | ICD-10-CM | POA: Diagnosis present

## 2023-07-09 DIAGNOSIS — D62 Acute posthemorrhagic anemia: Secondary | ICD-10-CM | POA: Diagnosis not present

## 2023-07-09 DIAGNOSIS — O0930 Supervision of pregnancy with insufficient antenatal care, unspecified trimester: Secondary | ICD-10-CM

## 2023-07-09 DIAGNOSIS — O9952 Diseases of the respiratory system complicating childbirth: Secondary | ICD-10-CM | POA: Diagnosis present

## 2023-07-09 DIAGNOSIS — Z3480 Encounter for supervision of other normal pregnancy, unspecified trimester: Principal | ICD-10-CM

## 2023-07-09 LAB — URINE DRUG SCREEN, QUALITATIVE (ARMC ONLY)
Amphetamines, Ur Screen: NOT DETECTED
Barbiturates, Ur Screen: NOT DETECTED
Benzodiazepine, Ur Scrn: NOT DETECTED
Cannabinoid 50 Ng, Ur ~~LOC~~: NOT DETECTED
Cocaine Metabolite,Ur ~~LOC~~: NOT DETECTED
MDMA (Ecstasy)Ur Screen: NOT DETECTED
Methadone Scn, Ur: NOT DETECTED
Opiate, Ur Screen: NOT DETECTED
Phencyclidine (PCP) Ur S: NOT DETECTED
Tricyclic, Ur Screen: NOT DETECTED

## 2023-07-09 LAB — CBC
HCT: 27.2 % — ABNORMAL LOW (ref 36.0–46.0)
Hemoglobin: 9.2 g/dL — ABNORMAL LOW (ref 12.0–15.0)
MCH: 29.8 pg (ref 26.0–34.0)
MCHC: 33.8 g/dL (ref 30.0–36.0)
MCV: 88 fL (ref 80.0–100.0)
Platelets: 195 10*3/uL (ref 150–400)
RBC: 3.09 MIL/uL — ABNORMAL LOW (ref 3.87–5.11)
RDW: 13.4 % (ref 11.5–15.5)
WBC: 11.4 10*3/uL — ABNORMAL HIGH (ref 4.0–10.5)
nRBC: 0 % (ref 0.0–0.2)

## 2023-07-09 LAB — TYPE AND SCREEN
ABO/RH(D): B POS
Antibody Screen: NEGATIVE

## 2023-07-09 LAB — HEPATITIS C ANTIBODY: HCV Ab: NONREACTIVE

## 2023-07-09 LAB — HEPATITIS B SURFACE ANTIGEN: Hepatitis B Surface Ag: NONREACTIVE

## 2023-07-09 LAB — RPR: RPR Ser Ql: NONREACTIVE

## 2023-07-09 MED ORDER — DIPHENHYDRAMINE HCL 25 MG PO CAPS: 25.0000 mg | ORAL_CAPSULE | Freq: Four times a day (QID) | ORAL | Status: DC | PRN

## 2023-07-09 MED ORDER — ACETAMINOPHEN 325 MG PO TABS
650.0000 mg | ORAL_TABLET | ORAL | Status: DC | PRN
  Administered 2023-07-09 (×2): 650 mg via ORAL
  Filled 2023-07-09: qty 2

## 2023-07-09 MED ORDER — MEDROXYPROGESTERONE ACETATE 150 MG/ML IM SUSP: 150.0000 mg | INTRAMUSCULAR | Status: DC

## 2023-07-09 MED ORDER — FERROUS SULFATE 325 (65 FE) MG PO TABS
325.0000 mg | ORAL_TABLET | Freq: Two times a day (BID) | ORAL | Status: DC
  Administered 2023-07-09 – 2023-07-10 (×3): 325 mg via ORAL
  Filled 2023-07-09 (×4): qty 1

## 2023-07-09 MED ORDER — LACTATED RINGERS IV SOLN: 500.0000 mL | INTRAVENOUS | Status: DC | PRN

## 2023-07-09 MED ORDER — ACETAMINOPHEN 325 MG PO TABS
650.0000 mg | ORAL_TABLET | ORAL | Status: DC | PRN
  Filled 2023-07-09: qty 2

## 2023-07-09 MED ORDER — WITCH HAZEL-GLYCERIN EX PADS
1.0000 | MEDICATED_PAD | CUTANEOUS | Status: DC | PRN
  Filled 2023-07-09 (×2): qty 100

## 2023-07-09 MED ORDER — INFLUENZA VIRUS VACC SPLIT PF (FLUZONE) 0.5 ML IM SUSY: 0.5000 mL | PREFILLED_SYRINGE | INTRAMUSCULAR | Status: DC

## 2023-07-09 MED ORDER — LACTATED RINGERS IV SOLN: INTRAVENOUS | Status: DC

## 2023-07-09 MED ORDER — SENNOSIDES-DOCUSATE SODIUM 8.6-50 MG PO TABS
2.0000 | ORAL_TABLET | Freq: Every day | ORAL | Status: DC
  Administered 2023-07-10: 2 via ORAL
  Filled 2023-07-09: qty 2

## 2023-07-09 MED ORDER — SIMETHICONE 80 MG PO CHEW: 80.0000 mg | CHEWABLE_TABLET | ORAL | Status: DC | PRN

## 2023-07-09 MED ORDER — COCONUT OIL OIL
1.0000 | TOPICAL_OIL | Status: DC | PRN
  Filled 2023-07-09: qty 7.5

## 2023-07-09 MED ORDER — LIDOCAINE HCL (PF) 1 % IJ SOLN: 30.0000 mL | INTRAMUSCULAR | Status: DC | PRN

## 2023-07-09 MED ORDER — FENTANYL CITRATE (PF) 100 MCG/2ML IJ SOLN: 50.0000 ug | INTRAMUSCULAR | Status: DC | PRN

## 2023-07-09 MED ORDER — DIBUCAINE (PERIANAL) 1 % EX OINT
1.0000 | TOPICAL_OINTMENT | CUTANEOUS | Status: DC | PRN
  Filled 2023-07-09: qty 28

## 2023-07-09 MED ORDER — ZOLPIDEM TARTRATE 5 MG PO TABS: 5.0000 mg | ORAL_TABLET | Freq: Every evening | ORAL | Status: DC | PRN

## 2023-07-09 MED ORDER — BENZOCAINE-MENTHOL 20-0.5 % EX AERO
1.0000 | INHALATION_SPRAY | CUTANEOUS | Status: DC | PRN
  Filled 2023-07-09: qty 56

## 2023-07-09 MED ORDER — ONDANSETRON HCL 4 MG/2ML IJ SOLN: 4.0000 mg | INTRAMUSCULAR | Status: DC | PRN

## 2023-07-09 MED ORDER — ONDANSETRON HCL 4 MG/2ML IJ SOLN: 4.0000 mg | Freq: Four times a day (QID) | INTRAMUSCULAR | Status: DC | PRN

## 2023-07-09 MED ORDER — ONDANSETRON HCL 4 MG PO TABS: 4.0000 mg | ORAL_TABLET | ORAL | Status: DC | PRN

## 2023-07-09 MED ORDER — OXYTOCIN-SODIUM CHLORIDE 30-0.9 UT/500ML-% IV SOLN: 2.5000 [IU]/h | INTRAVENOUS | Status: DC

## 2023-07-09 MED ORDER — TETANUS-DIPHTH-ACELL PERTUSSIS 5-2.5-18.5 LF-MCG/0.5 IM SUSY
0.5000 mL | PREFILLED_SYRINGE | Freq: Once | INTRAMUSCULAR | Status: AC
  Administered 2023-07-10: 0.5 mL via INTRAMUSCULAR
  Filled 2023-07-09: qty 0.5

## 2023-07-09 MED ORDER — SOD CITRATE-CITRIC ACID 500-334 MG/5ML PO SOLN: 30.0000 mL | ORAL | Status: DC | PRN

## 2023-07-09 MED ORDER — PRENATAL MULTIVITAMIN CH
1.0000 | ORAL_TABLET | Freq: Every day | ORAL | Status: DC
  Administered 2023-07-09 – 2023-07-10 (×2): 1 via ORAL
  Filled 2023-07-09 (×2): qty 1

## 2023-07-09 MED ORDER — OXYTOCIN-SODIUM CHLORIDE 30-0.9 UT/500ML-% IV SOLN
INTRAVENOUS | Status: AC
  Administered 2023-07-09: 333 mL via INTRAVENOUS
  Filled 2023-07-09: qty 500

## 2023-07-09 MED ORDER — OXYTOCIN BOLUS FROM INFUSION: 333.0000 mL | Freq: Once | INTRAVENOUS | Status: AC

## 2023-07-09 MED ORDER — IBUPROFEN 600 MG PO TABS
600.0000 mg | ORAL_TABLET | Freq: Four times a day (QID) | ORAL | Status: DC
  Administered 2023-07-09 – 2023-07-10 (×6): 600 mg via ORAL
  Filled 2023-07-09 (×6): qty 1

## 2023-07-09 NOTE — Progress Notes (Signed)
Postpartum Day  0  Subjective: 22 y.o. [id number] postpartum day #0 status post normal spontaneous vaginal delivery. She is ambulating, is tolerating po, is voiding spontaneously.  Her pain is well controlled on PO pain medications. Her lochia is less than menses.  Objective: BP 95/63 (BP Location: Right Arm)   Pulse 82   Temp 98.3 F (36.8 C) (Oral)   Resp 20   LMP  (LMP Unknown)   SpO2 100%   Breastfeeding Unknown    Physical Exam:  General: alert, cooperative, appears stated age, and no distress Breasts: soft/nontender Pulm: nl effort Abdomen: soft, non-tender, active bowel sounds Uterine Fundus: firm Perineum: minimal edema, intact Lochia: appropriate DVT Evaluation: No evidence of DVT seen on physical exam. Negative Homan's sign. No cords or calf tenderness. No significant calf/ankle edema.  Recent Labs    07/09/23 0324  HGB 9.2*  HCT 27.2*  WBC 11.4*  PLT 195    Assessment/Plan: 22 y.o. G2P2002 postpartum day # 0  1. Continue routine postpartum care  2. Infant feeding status: formula feeding -Encouraged snug fitting bra, cold application, Tylenol PRN, and cabbage leaves for engorgement for formula feeding   3. Contraception plan:  Undecided   4. Acute blood loss anemia - clinically significant.  -Hemodynamically stable and asymptomatic -Intervention: start on oral supplementation with ferrous sulfate 325 mg   5. Immunization status:    Pending     Disposition: continue inpatient postpartum care    LOS: 0 days   Katrina Sherman, CNM 07/09/2023, 9:08 AM   ----- Katrina Sherman Certified Nurse Midwife [facility]

## 2023-07-09 NOTE — Discharge Summary (Signed)
Postpartum Discharge Summary  Patient Name: Katrina Sherman DOB: [birth year] MRN: [id number]  Date of admission: 07/09/2023 Delivery date:07/09/2023 Delivering provider: Donato Schultz RENEE Date of discharge: 07/10/2023  Primary OB: [facility] LMP:No LMP recorded (lmp unknown). EDC Estimated Date of Delivery: 06/30/23 Gestational Age at Delivery: [redacted]w[redacted]d   Admitting diagnosis: Uterine contractions [O47.9] Intrauterine pregnancy: [redacted]w[redacted]d     Secondary diagnosis:   Principal Problem:   NSVD (normal spontaneous vaginal delivery) Active Problems:   Obesity affecting pregnancy, antepartum   Limited prenatal care   History of congenital cardiac septal defect   Precipitous delivery   Discharge Diagnosis: Term Pregnancy Delivered                                                Post partum procedures: none Augmentation:: N/A Complications: None Delivery Type: spontaneous vaginal delivery Anesthesia: non-pharmacological methods Placenta: spontaneous To Pathology: No  Laceration: none Episiotomy: none  Prenatal Labs:  Blood type/Rh B positive  Antibody screen neg  Rubella pending  Varicella pending  RPR NR  HBsAg pending  HIV pending  GC neg  Chlamydia neg  Genetic screening none  1 hour GTT Not performed  3 hour GTT    GBS Unknown, results pending    Hospital course: Onset of Labor With Vaginal Delivery      22 y.o. yo [id number] at [redacted]w[redacted]d was admitted in Active Labor on 07/09/2023. Labor course was complicated by precipitous delivery. She delivered en caul on the L&D stretcher.  Membrane Rupture Time/Date: 1:12 AM,07/09/2023  Delivery Method:Vaginal, Spontaneous Operative Delivery:N/A Episiotomy: None Lacerations:  None Patient had a postpartum course complicated by none.  She is ambulating, tolerating a regular diet, passing flatus, and urinating well. Patient is discharged home in stable condition on 07/10/23.  Newborn Data: Birth date:07/09/2023 Birth  time:1:12 AM Gender:Female Living status:Living Apgars: ,  Weight:3490 g  Magnesium Sulfate received: No BMZ received: No Rhophylac:No MMR: rubella immunity pending Varivax vaccine given: was not indicated T-DaP: Given prior to discharge Flu: received prior to admission  Transfusion:No  Physical exam  Vitals:   07/09/23 1547 07/09/23 2040 07/09/23 2337 07/10/23 0759  BP: 111/69 (!) 107/59 (!) 104/53 108/63  Pulse: 93 81 67 70  Resp: 18 18 17 18   Temp: 98.8 F (37.1 C) 98.8 F (37.1 C) 98.6 F (37 C) 98.4 F (36.9 C)  TempSrc: Oral Oral Oral Oral  SpO2: 99% 100% 100% 99%   General: alert, cooperative, and no distress Lochia: appropriate Uterine Fundus: firm Perineum:minimal edema/intact Incision: n/a DVT Evaluation: No evidence of DVT seen on physical exam.  Labs: Lab Results  Component Value Date   WBC 8.1 07/10/2023   HGB 7.9 (L) 07/10/2023   HCT 23.1 (L) 07/10/2023   MCV 86.5 07/10/2023   PLT 168 07/10/2023      Latest Ref Rng & Units 01/10/2023   11:23 AM  CMP  Glucose 70 - 99 mg/dL 350   BUN 6 - 20 mg/dL 9   Creatinine 0.93 - 8.18 mg/dL 2.99   Sodium 371 - 696 mmol/L 134   Potassium 3.5 - 5.1 mmol/L 2.9   Chloride 98 - 111 mmol/L 105   CO2 22 - 32 mmol/L 18   Calcium 8.9 - 10.3 mg/dL 8.8   Total Protein 6.5 - 8.1 g/dL 6.7   Total Bilirubin 0.3 - 1.2 mg/dL  0.6   Alkaline Phos 38 - 126 U/L 48   AST 15 - 41 U/L 27   ALT 0 - 44 U/L 22    Edinburgh Score:    07/09/2023   12:10 PM  Edinburgh Postnatal Depression Scale Screening Tool  I have been able to laugh and see the funny side of things. 0  I have looked forward with enjoyment to things. 0  I have blamed myself unnecessarily when things went wrong. 0  I have been anxious or worried for no good reason. 0  I have felt scared or panicky for no good reason. 0  Things have been getting on top of me. 0  I have been so unhappy that I have had difficulty sleeping. 0  I have felt sad or miserable. 0   I have been so unhappy that I have been crying. 0  The thought of harming myself has occurred to me. 0  Edinburgh Postnatal Depression Scale Total 0    Risk assessment for postpartum VTE and prophylactic treatment: Very high risk factors: None High risk factors: BMI 40-50 kg/m2 Moderate risk factors: None  Postpartum VTE prophylaxis with LMWH not indicated  After visit meds:  Allergies as of 07/10/2023       Reactions   Kiwi Extract    Other reaction(s): Unknown   Other    Other reaction(s): Unknown DUST MITES   Pollen Extract    Other reaction(s): Unknown        Medication List     STOP taking these medications    Doxylamine-Pyridoxine 10-10 MG Tbec Commonly known as: Diclegis       TAKE these medications    acetaminophen 325 MG tablet Commonly known as: TYLENOL Take 2 tablets (650 mg total) by mouth every 6 (six) hours as needed for mild pain or fever (for pain scale < 4  OR  temperature  >/=  100.5 F).   calcium carbonate 500 MG chewable tablet Commonly known as: TUMS - dosed in mg elemental calcium Chew 2 tablets (400 mg of elemental calcium total) by mouth 4 (four) times daily as needed for indigestion.   cetirizine 10 MG tablet Commonly known as: ZyrTEC Allergy Take 1 tablet (10 mg total) by mouth daily for 14 days.   fluticasone 50 MCG/ACT nasal spray Commonly known as: FLONASE Place 1 spray into both nostrils daily.   ibuprofen 600 MG tablet Commonly known as: ADVIL Take 1 tablet (600 mg total) by mouth every 6 (six) hours as needed.   ProAir HFA 108 (90 Base) MCG/ACT inhaler Generic drug: albuterol Inhale 2 puffs into the lungs as needed.       Discharge home in stable condition Infant Feeding: Bottle Infant Disposition:home with mother Discharge instruction: per After Visit Summary and Postpartum booklet. Activity: Advance as tolerated. Pelvic rest for 6 weeks.  Diet: routine diet Anticipated Birth Control:  TBD Postpartum  Appointment:6 weeks Additional Postpartum F/U:  none Future Appointments:No future appointments. Follow up Visit:  Follow-up Information     Janyce Llanos, CNM. Schedule an appointment as soon as possible for a visit in 6 week(s).   Specialty: Certified Nurse Midwife Contact information: [address] [place] [address] [phone]                 Plan:  Katrina Sherman was discharged to home in good condition. Follow-up appointment as directed.    Signed: Cyril Mourning 07/10/2023 10:59 AM

## 2023-07-09 NOTE — OB Triage Note (Addendum)
Pt presents to L&D via EMS reporting ctx every 2-3 mins, LOF, and light bleeding. Reports she lost her mucus plug today. Monitors applied and assessing. FHT 145. VSS. Endorses positive fetal movement. Requesting pain meds. Having difficulty communicating with patient as she does is very uncomfortable. Donato Schultz, CNM notified of patient's arrival and en route to pt room. Saramarie Stinger B. Brantley Wiley, RN BSN 07/09/2023 1:03 AM

## 2023-07-09 NOTE — H&P (Cosign Needed Addendum)
OB History & Physical   History of Present Illness:  Chief Complaint:   HPI:  Katrina Sherman is a 22 y.o. G60P2002 female at [redacted]w[redacted]d dated by 9wk [place].  She presents to L&D for uterine contractions and imminent delivery. She reports contractions started around 10pm. See delivery note for more details.   Pregnancy Issues: 1. Obesity 2. Limited prenatal care - 2 total visits (NOB at 18wks, prenatal visit at 40wks) 3. 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 Anatomy US performed in 3rd trimester, notes "Detailed cardiac  anatomy could not be assessed because of fetal position."   Maternal Medical History:   Past Medical History:  Diagnosis Date   Anxiety    Asthma    Hypertension     Past Surgical History:  Procedure Laterality Date   NO PAST SURGERIES      Allergies  Allergen Reactions   Kiwi Extract     Other reaction(s): Unknown   Other     Other reaction(s): Unknown DUST MITES   Pollen Extract     Other reaction(s): Unknown    Prior to Admission medications   Medication Sig Start Date End Date Taking? Authorizing Provider  acetaminophen (TYLENOL) 325 MG tablet Take 2 tablets (650 mg total) by mouth every 6 (six) hours as needed for mild pain or fever (for pain scale < 4  OR  temperature  >/=  100.5 F). 02/19/23  Yes Josha Weekley Renee, CNM  calcium carbonate (TUMS - DOSED IN MG ELEMENTAL CALCIUM) 500 MG chewable tablet Chew 2 tablets (400 mg of elemental calcium total) by mouth 4 (four) times daily as needed for indigestion. Patient not taking: Reported on 06/15/2023 02/19/23   Janyce Llanos, CNM  cetirizine (ZYRTEC ALLERGY) 10 MG tablet Take 1 tablet (10 mg total) by mouth daily for 14 days. 11/19/19 12/03/19  Miguel Aschoff., MD  Doxylamine-Pyridoxine (DICLEGIS) 10-10 MG TBEC Take 2 tablets by mouth at bedtime as needed. Patient not taking: Reported on 02/15/2023 11/28/22   Pilar Jarvis, MD  fluticasone [facility]) 50 MCG/ACT nasal spray  Place 1 spray into both nostrils daily. Patient not taking: Reported on 06/15/2023    [provider]  PROAIR HFA 108 [phone] Base) MCG/ACT inhaler Inhale 2 puffs into the lungs as needed. Patient not taking: Reported on 06/15/2023 06/03/20   [provider]  famotidine (PEPCID) 20 MG tablet Take 1 tablet (20 mg total) by mouth 2 (two) times daily for 5 days. 05/17/19 09/24/19  Enid Derry, PA-C     Prenatal care site: [facility]    Social History: She  reports that she has never smoked. She has never used smokeless tobacco. She reports that she does not drink alcohol and does not use drugs.  Family History: family history includes Breast cancer in her maternal grandmother; Healthy in her maternal grandfather, mother, paternal grandfather, and paternal grandmother.   Review of Systems: A full review of systems was performed and negative except as noted in the HPI.     Physical Exam:  Vital Signs: BP 118/84 (BP Location: Left Arm)   Pulse 72   Temp 98.3 F (36.8 C) (Oral)   Resp 18   LMP  (LMP Unknown)   Breastfeeding Unknown  General: no acute distress.  HEENT: normocephalic, atraumatic Heart: regular rate & rhythm.  No murmurs/rubs/gallops Lungs: clear to auscultation bilaterally, normal respiratory effort Abdomen: soft, gravid, non-tender;  EFW: 6.5lb Pelvic:   External: Normal external female genitalia  Cervix: fetal head visible   Extremities: non-tender, symmetric, no edema bilaterally.  DTRs: +2  Neurologic: Alert & oriented x 3.    No results found for this or any previous visit (from the past 24 hour(s)).  Pertinent Results:  Prenatal Labs: Blood type/Rh B positive  Antibody screen neg  Rubella pending  Varicella pending  RPR NR  HBsAg pending  HIV pending  GC neg  Chlamydia neg  Genetic screening none  1 hour GTT Not performed  3 hour GTT   GBS Unknown, results pending   FHT: 135bpm, moderate variability, no accelerations,  recurrent early decelerations, prolonged deceleration to the 90s immediately prior to delivery TOCO: contractions q2-76min SVE: fetal head visible on perineum   Cephalic by leopolds  No results found.  Assessment:  Katrina Sherman is a 22 y.o. G73P2002 female at [redacted]w[redacted]d with uterine contractions and imminent delivery.   Plan:  1. Admit to Labor & Delivery; consents reviewed and obtained - Dr. Jean Rosenthal notified of admission  2. Fetal Well being  - Fetal Tracing: Category II tracing, imminent delivery - Group B Streptococcus ppx indicated: not enough time to treat, GBS results pending - Presentation: vertex confirmed by visualization   3. Routine OB: - Prenatal labs reviewed, as above - Rh positive - CBC, T&S, RPR on admit - prenatal labs of Hep B, Hep C, rubella and varicella immunity ordered - Clear fluids, saline lock  4. Monitoring of Labor  -  Contractions q2-47min, external toco in place -  Pelvis proven to 2438g -  Plan for continuous fetal monitoring  -  Maternal pain control as desired; not enough time to provide pain control treatment - Anticipate vaginal delivery  5. Post Partum Planning: - Infant feeding: formula - Contraception: depo  Janyce Llanos, CNM 07/09/23 1:37 AM

## 2023-07-10 LAB — MEASLES/MUMPS/RUBELLA IMMUNITY
Mumps IgG: 10.8 [AU]/ml — ABNORMAL LOW
Rubella: 1.7 {index}
Rubeola IgG: 156 [AU]/ml

## 2023-07-10 LAB — CBC
HCT: 23.1 % — ABNORMAL LOW (ref 36.0–46.0)
Hemoglobin: 7.9 g/dL — ABNORMAL LOW (ref 12.0–15.0)
MCH: 29.6 pg (ref 26.0–34.0)
MCHC: 34.2 g/dL (ref 30.0–36.0)
MCV: 86.5 fL (ref 80.0–100.0)
Platelets: 168 K/uL (ref 150–400)
RBC: 2.67 MIL/uL — ABNORMAL LOW (ref 3.87–5.11)
RDW: 13.7 % (ref 11.5–15.5)
WBC: 8.1 K/uL (ref 4.0–10.5)
nRBC: 0 % (ref 0.0–0.2)

## 2023-07-10 LAB — VARICELLA ZOSTER ANTIBODY, IGG: Varicella IgG: REACTIVE

## 2023-07-10 MED ORDER — IBUPROFEN 600 MG PO TABS: 600.0000 mg | ORAL_TABLET | Freq: Four times a day (QID) | ORAL | Status: AC | PRN

## 2023-07-10 NOTE — Progress Notes (Signed)
Pt discharged with infant.  Discharge instructions, prescriptions and follow up appointment given to and reviewed with pt. Pt verbalized understanding. Escorted out by auxillary. 

## 2024-07-21 ENCOUNTER — Ambulatory Visit

## 2024-08-10 NOTE — Progress Notes (Deleted)
 PCP:  Patient, No Pcp Per   No chief complaint on file.    HPI:      [name] is a 23 y.o. G1P1001 whose LMP was No LMP recorded., presents today for her annual examination.  Her menses are monthly, lasting 6-7 days, mod flow with small clots, no BTB, no dysmen. Would like to restart BC. Did depo PP with wt gain.   Sex activity: not sexually active.  Last Pap: 11/24/21 Results were NILM Hx of STDs: none  There is a FH of breast cancer in her MGM, age unknown. There is no FH of ovarian cancer. The patient does do self-breast exams.  Tobacco use: The patient denies current or previous tobacco use. Alcohol use: none No drug use.  Exercise: moderately active  She does get adequate calcium  and Vitamin D in her diet.  Would like ref for breast reduction. Has always had large breasts. Causing back pain. Pt just stopped pumping/BF. Gained wt with depo and pregnancy but breasts have been the same size, R>L. Is trying to lose wt.  Patient Active Problem List   Diagnosis Date Noted   Obesity affecting pregnancy, antepartum 07/09/2023   Limited prenatal care 07/09/2023   History of congenital cardiac septal defect 07/09/2023   NSVD (normal spontaneous vaginal delivery) 07/09/2023   Precipitous delivery 07/09/2023   Vaginal bleeding in pregnancy 02/19/2023   Supervision of other normal pregnancy, antepartum 12/01/2022   Trichomoniasis 09/20/2022   Supervision of high risk pregnancy, antepartum 06/28/2020   Encounter for supervision of high risk pregnancy due to fetal anomaly, second trimester 06/28/2020   Pregnancy 05/01/2020    Past Surgical History:  Procedure Laterality Date   NO PAST SURGERIES      Family History  Problem Relation Age of Onset   Healthy Mother    Breast cancer Maternal Grandmother        not sure of age   Healthy Maternal Grandfather    Healthy Paternal Grandmother    Healthy Paternal Grandfather     Social History   Socioeconomic History    Marital status: Single    Spouse name: Not on file   Number of children: 1   Years of education: 12   Highest education level: Not on file  Occupational History   Occupation: [employer] - 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  Tobacco Use   Smoking status: Never   Smokeless tobacco: Never  Vaping Use   Vaping status: Never Used  Substance and Sexual Activity   Alcohol use: Never   Drug use: Never   Sexual activity: Yes    Partners: Male    Birth control/protection: None, Surgical  Other Topics Concern   Not on file  Social History Narrative   Not on file   Social Drivers of Health   Financial Resource Strain: Low Risk  (12/01/2022)   Overall Financial Resource Strain (CARDIA)    Difficulty of Paying Living Expenses: Not hard at all  Food Insecurity: No Food Insecurity (07/09/2023)   Hunger Vital Sign    Worried About Running Out of Food in the Last Year: Never true    Ran Out of Food in the Last Year: Never true  Transportation Needs: No Transportation Needs (07/09/2023)   PRAPARE - Administrator, Civil Service (Medical): No    Lack of Transportation (Non-Medical): No  Physical Activity: Sufficiently Active (12/01/2022)   Exercise Vital Sign    Days of  Exercise per Week: 7 days    Minutes of Exercise per Session: 30 min  Stress: No Stress Concern Present (12/01/2022)   [employer] of Occupational Health - Occupational Stress Questionnaire    Feeling of Stress : Not at all  Social Connections: Unknown (12/01/2022)   Social Connection and Isolation Panel    Frequency of Communication with Friends and Family: More than three times a week    Frequency of Social Gatherings with Friends and Family: Once a week    Attends Religious Services: More than 4 times per year    Active Member of [employer] or Organizations: No    Attends Banker Meetings: Never    Marital Status: Not on file  Intimate Partner Violence: Not At Risk (12/01/2022)    Humiliation, Afraid, Rape, and Kick questionnaire    Fear of Current or Ex-Partner: No    Emotionally Abused: No    Physically Abused: No    Sexually Abused: No     Current Outpatient Medications:    acetaminophen  (TYLENOL ) 325 MG tablet, Take 2 tablets (650 mg total) by mouth every 6 (six) hours as needed for mild pain or fever (for pain scale < 4  OR  temperature  >/=  100.5 F)., Disp: , Rfl:    calcium  carbonate (TUMS - DOSED IN MG ELEMENTAL CALCIUM ) 500 MG chewable tablet, Chew 2 tablets (400 mg of elemental calcium  total) by mouth 4 (four) times daily as needed for indigestion. (Patient not taking: Reported on 06/15/2023), Disp: , Rfl:    cetirizine  (ZYRTEC  ALLERGY) 10 MG tablet, Take 1 tablet (10 mg total) by mouth daily for 14 days., Disp: 14 tablet, Rfl: 0   fluticasone (FLONASE) 50 MCG/ACT nasal spray, Place 1 spray into both nostrils daily. (Patient not taking: Reported on 06/15/2023), Disp: , Rfl:    ibuprofen  (ADVIL ) 600 MG tablet, Take 1 tablet (600 mg total) by mouth every 6 (six) hours as needed., Disp: , Rfl:    PROAIR HFA 108 (90 Base) MCG/ACT inhaler, Inhale 2 puffs into the lungs as needed., Disp: , Rfl:      ROS:  Review of Systems  Constitutional:  Negative for fatigue, fever and unexpected weight change.  Respiratory:  Negative for cough, shortness of breath and wheezing.   Cardiovascular:  Negative for chest pain, palpitations and leg swelling.  Gastrointestinal:  Negative for blood in stool, constipation, diarrhea, nausea and vomiting.  Endocrine: Negative for cold intolerance, heat intolerance and polyuria.  Genitourinary:  Negative for dyspareunia, dysuria, flank pain, frequency, genital sores, hematuria, menstrual problem, pelvic pain, urgency, vaginal bleeding, vaginal discharge and vaginal pain.  Musculoskeletal:  Negative for back pain, joint swelling and myalgias.  Skin:  Negative for rash.  Neurological:  Negative for dizziness, syncope, light-headedness,  numbness and headaches.  Hematological:  Negative for adenopathy.  Psychiatric/Behavioral:  Negative for agitation, confusion, sleep disturbance and suicidal ideas. The patient is not nervous/anxious.    BREAST: No symptoms   Objective: There were no vitals taken for this visit.   Physical Exam Constitutional:      Appearance: She is well-developed.  Genitourinary:     Vulva normal.     Genitourinary Comments: LARGE, PENDULOUS BREASTS; NO MASSES     Right Labia: No rash, tenderness or lesions.    Left Labia: No tenderness, lesions or rash.    No vaginal discharge, erythema or tenderness.      Right Adnexa: not tender and no mass present.    Left  Adnexa: not tender and no mass present.    No cervical friability or polyp.     Uterus is not enlarged or tender.  Breasts:    Right: No mass, nipple discharge, skin change or tenderness.     Left: No mass, nipple discharge, skin change or tenderness.  Neck:     Thyroid: No thyromegaly.  Cardiovascular:     Rate and Rhythm: Normal rate and regular rhythm.     Heart sounds: Normal heart sounds. No murmur heard. Pulmonary:     Effort: Pulmonary effort is normal.     Breath sounds: Normal breath sounds.  Abdominal:     Palpations: Abdomen is soft.     Tenderness: There is no abdominal tenderness. There is no guarding or rebound.  Musculoskeletal:        General: Normal range of motion.     Cervical back: Normal range of motion.  Lymphadenopathy:     Cervical: No cervical adenopathy.  Neurological:     General: No focal deficit present.     Mental Status: She is alert and oriented to person, place, and time.     Cranial Nerves: No cranial nerve deficit.  Skin:    General: Skin is warm and dry.  Psychiatric:        Mood and Affect: Mood normal.        Behavior: Behavior normal.        Thought Content: Thought content normal.        Judgment: Judgment normal.  Vitals reviewed.     Assessment/Plan: Encounter for annual  routine gynecological examination  Cervical cancer screening - Plan: Cytology - PAP  Screening for STD (sexually transmitted disease) - Plan: Cytology - PAP  Encounter for initial prescription of contraceptive pills - Plan: Norethindrone Acetate-Ethinyl Estrad-FE (MICROGESTIN  24 FE) 1-20 MG-MCG(24) tablet; OCP start with next menses, condoms.   Large breasts - Plan: Ambulatory referral to Plastic Surgery; refer to plastic surg for reduction. Recommended wt loss with PCP with Rx meds.    No orders of the defined types were placed in this encounter.            GYN counsel family planning choices, adequate intake of calcium  and vitamin D, diet and exercise     F/U  No follow-ups on file.  Aikam Vinje B. Dashana Guizar, PA-C 08/10/2024 7:07 AM

## 2024-08-11 ENCOUNTER — Other Ambulatory Visit: Payer: Self-pay

## 2024-08-11 ENCOUNTER — Emergency Department
Admission: EM | Admit: 2024-08-11 | Discharge: 2024-08-11 | Disposition: A | Discharge status: Home / Self Care | Attending: Emergency Medicine | Admitting: Emergency Medicine

## 2024-08-11 ENCOUNTER — Ambulatory Visit: Admitting: Obstetrics and Gynecology

## 2024-08-11 DIAGNOSIS — J45909 Unspecified asthma, uncomplicated: Secondary | ICD-10-CM | POA: Insufficient documentation

## 2024-08-11 DIAGNOSIS — I10 Essential (primary) hypertension: Secondary | ICD-10-CM | POA: Diagnosis not present

## 2024-08-11 DIAGNOSIS — K0889 Other specified disorders of teeth and supporting structures: Secondary | ICD-10-CM | POA: Diagnosis present

## 2024-08-11 DIAGNOSIS — Z01419 Encounter for gynecological examination (general) (routine) without abnormal findings: Secondary | ICD-10-CM

## 2024-08-11 DIAGNOSIS — Z113 Encounter for screening for infections with a predominantly sexual mode of transmission: Secondary | ICD-10-CM

## 2024-08-11 MED ORDER — AMOXICILLIN 500 MG PO TABS: 500.0000 mg | ORAL_TABLET | Freq: Three times a day (TID) | ORAL | 0 refills | Status: AC

## 2024-08-11 NOTE — ED Notes (Signed)
 Pt left without d/c paperwork

## 2024-08-11 NOTE — Discharge Instructions (Signed)
 I believe your dental pain is coming from an infection.  I have sent some antibiotics to the pharmacy.  Please take these as prescribed.  Make sure you take all of the medication even if you begin to feel better.  You can take 650 mg of Tylenol  and 600 mg of ibuprofen  every 6 hours as needed for pain. You can use ice, heat and other topical pain relievers like oragel. Please follow up with your dentist.  I have attached information for other dental clinics in the area that you may be able to get in to see sooner.

## 2024-08-11 NOTE — ED Provider Notes (Signed)
 [facility] Provider Note    Event Date/Time   First MD Initiated Contact with Patient 08/11/24 1429     (approximate)   History   Dental Pain   HPI  Katrina Sherman is a 23 y.o. female with PMH of asthma, anxiety and hypertension presents for evaluation of dental pain.  Patient states she was post to have this tooth pulled but her dentist canceled her appointment as somebody called in.  She is reach out to the office to try to reschedule and has not been able to get in to see them yet.  She reports last night she had a fever.  She took Tylenol  without much relief.      Physical Exam   Triage Vital Signs: ED Triage Vitals  Encounter Vitals Group     BP 08/11/24 1345 102/76     Girls Systolic BP Percentile --      Girls Diastolic BP Percentile --      Boys Systolic BP Percentile --      Boys Diastolic BP Percentile --      Pulse Rate 08/11/24 1345 73     Resp 08/11/24 1345 18     Temp 08/11/24 1345 99.5 F (37.5 C)     Temp Source 08/11/24 1345 Oral     SpO2 08/11/24 1345 99 %     Weight 08/11/24 1346 214 lb (97.1 kg)     Height 08/11/24 1346 5' 3 (1.6 m)     Head Circumference --      Peak Flow --      Pain Score 08/11/24 1345 10     Pain Loc --      Pain Education --      Exclude from Growth Chart --     Most recent vital signs: Vitals:   08/11/24 1345  BP: 102/76  Pulse: 73  Resp: 18  Temp: 99.5 F (37.5 C)  SpO2: 99%   General: Awake, no distress.  CV:  Good peripheral perfusion.  Resp:  Normal effort.  Abd:  No distention.  Other:  Oral mucous membranes are moist, no significant facial swelling noted, there is a hole in the lower back molar on the left side which is the source of patient's pain, no abscess felt along the gumline   ED Results / Procedures / Treatments   Labs (all labs ordered are listed, but only abnormal results are displayed) Labs Reviewed - No data to display   PROCEDURES:  Critical Care performed:  No  Procedures   MEDICATIONS ORDERED IN ED: Medications - No data to display   IMPRESSION / MDM / ASSESSMENT AND PLAN / ED COURSE  I reviewed the triage vital signs and the nursing notes.                             23 year old female presents for evaluation of dental pain.  Vital signs are stable patient NAD on exam.  Differential diagnosis includes, but is not limited to, dental fracture, dental infection, dental abscess.  Patient's presentation is most consistent with acute, uncomplicated illness.  Patient has a hole in the back lower molar which I suspect has become infected. No abscess identified that needs to be drained.  Patient likely needs to have a root canal.  Will start her on oral antibiotics.  Recommended Tylenol  and ibuprofen  as well as topical pain relievers.  Advised to follow-up with her dentist.  She voiced understanding, all questions were answered and she was stable at discharge.      FINAL CLINICAL IMPRESSION(S) / ED DIAGNOSES   Final diagnoses:  Pain, dental     Rx / DC Orders   ED Discharge Orders          Ordered    amoxicillin  (AMOXIL ) 500 MG tablet  3 times daily        08/11/24 1438             Note:  This document was prepared using Dragon voice recognition software and may include unintentional dictation errors.   Cleaster Tinnie LABOR, PA-C 08/11/24 1447    Arlander Charleston, MD 08/12/24 [phone]

## 2024-08-11 NOTE — ED Triage Notes (Addendum)
 Pt presents to the ED via POV from home with left lower tooth pain that started last night. Pt states that she as supposed to have the tooth pulled. Pt states that she called her dentist and her dentist was unable to get her in to be seen. Pt states that she took 1,000mg  tylenol  at 2am.

## 2024-08-21 ENCOUNTER — Encounter: Payer: Self-pay | Admitting: Emergency Medicine

## 2024-08-21 ENCOUNTER — Other Ambulatory Visit: Payer: Self-pay

## 2024-08-21 ENCOUNTER — Emergency Department
Admission: EM | Admit: 2024-08-21 | Discharge: 2024-08-21 | Disposition: A | Discharge status: Home / Self Care | Attending: Emergency Medicine | Admitting: Emergency Medicine

## 2024-08-21 DIAGNOSIS — R110 Nausea: Secondary | ICD-10-CM | POA: Diagnosis not present

## 2024-08-21 DIAGNOSIS — Z5321 Procedure and treatment not carried out due to patient leaving prior to being seen by health care provider: Secondary | ICD-10-CM | POA: Insufficient documentation

## 2024-08-21 DIAGNOSIS — R109 Unspecified abdominal pain: Secondary | ICD-10-CM | POA: Diagnosis present

## 2024-08-21 LAB — POC URINE PREG, ED: Preg Test, Ur: POSITIVE — AB

## 2024-08-21 LAB — URINALYSIS, ROUTINE W REFLEX MICROSCOPIC
Bilirubin Urine: NEGATIVE
Glucose, UA: NEGATIVE mg/dL
Hgb urine dipstick: NEGATIVE
Ketones, ur: NEGATIVE mg/dL
Leukocytes,Ua: NEGATIVE
Nitrite: NEGATIVE
Protein, ur: NEGATIVE mg/dL
Specific Gravity, Urine: 1.023 (ref 1.005–1.030)
pH: 7 (ref 5.0–8.0)

## 2024-08-21 NOTE — ED Notes (Signed)
Patient refusing blood work in triage 

## 2024-08-21 NOTE — ED Triage Notes (Signed)
 Pt to ED via PO c/o abd pain and nausea for the past few weeks. Pt states that today the pain was sharp and in the center of her stomach. Pt is currently in NAD.

## 2024-08-21 NOTE — ED Notes (Signed)
 Pt given urine cup and went to the restroom to try and collect sample

## 2024-08-25 ENCOUNTER — Ambulatory Visit

## 2024-08-26 ENCOUNTER — Encounter: Payer: Self-pay | Admitting: *Deleted

## 2024-08-26 ENCOUNTER — Other Ambulatory Visit: Payer: Self-pay

## 2024-08-26 ENCOUNTER — Emergency Department
Admission: EM | Admit: 2024-08-26 | Discharge: 2024-08-26 | Disposition: A | Discharge status: Home / Self Care | Attending: Emergency Medicine | Admitting: Emergency Medicine

## 2024-08-26 DIAGNOSIS — J45909 Unspecified asthma, uncomplicated: Secondary | ICD-10-CM | POA: Diagnosis not present

## 2024-08-26 DIAGNOSIS — Z3A00 Weeks of gestation of pregnancy not specified: Secondary | ICD-10-CM | POA: Diagnosis not present

## 2024-08-26 DIAGNOSIS — Z5329 Procedure and treatment not carried out because of patient's decision for other reasons: Secondary | ICD-10-CM | POA: Diagnosis not present

## 2024-08-26 DIAGNOSIS — O26891 Other specified pregnancy related conditions, first trimester: Secondary | ICD-10-CM | POA: Diagnosis present

## 2024-08-26 DIAGNOSIS — O99511 Diseases of the respiratory system complicating pregnancy, first trimester: Secondary | ICD-10-CM | POA: Diagnosis not present

## 2024-08-26 DIAGNOSIS — O26899 Other specified pregnancy related conditions, unspecified trimester: Secondary | ICD-10-CM

## 2024-08-26 DIAGNOSIS — R109 Unspecified abdominal pain: Secondary | ICD-10-CM | POA: Diagnosis not present

## 2024-08-26 DIAGNOSIS — O10011 Pre-existing essential hypertension complicating pregnancy, first trimester: Secondary | ICD-10-CM | POA: Diagnosis not present

## 2024-08-26 NOTE — ED Notes (Signed)
 Pt upset blood work was needed and left ER without any interventions.

## 2024-08-26 NOTE — ED Triage Notes (Addendum)
 Pt ambulatory to triage.  Pt has low abd pain with vag bleeding  pt is pregnant.  No vag discharge or dysuria.  Pt alert.  Pt refused labs in triage.  Pt states someone called and told pt to come to hospital for US .

## 2024-08-26 NOTE — ED Provider Notes (Signed)
° °  [facility] Provider Note    None    (approximate)   History   Abdominal Pain   HPI  Katrina Sherman is a 23 y.o. female  with history of asthma, hypertension, anxiety and as listed in EMR presents to the emergency department for evaluation of vaginal bleeding in pregnancy. She is unsure how many weeks pregnant. She denies vaginal discharge or dysuria. Bleeding occurred last night, but has not had any today. She is currently having mid abdominal pain. No vomiting or diarrhea.     Physical Exam    Vitals:   08/26/24 1512  BP: 112/71  Pulse: 71  Resp: 18  Temp: 98.2 F (36.8 C)  SpO2: 98%    General: Awake, no distress.  CV:  Good peripheral perfusion.  Resp:  Normal effort.  Abd:  No distention.  Other:     ED Results / Procedures / Treatments   Labs (all labs ordered are listed, but only abnormal results are displayed)  Labs Reviewed  LIPASE, BLOOD  COMPREHENSIVE METABOLIC PANEL WITH GFR  CBC  URINALYSIS, ROUTINE W REFLEX MICROSCOPIC  HCG, QUANTITATIVE, PREGNANCY     EKG  Not indicated.   RADIOLOGY  Image and radiology report reviewed and interpreted by me. Radiology report consistent with the same.  Patient declined.  PROCEDURES:  Critical Care performed: No  Procedures   MEDICATIONS ORDERED IN ED:  Medications - No data to display   IMPRESSION / MDM / ASSESSMENT AND PLAN / ED COURSE   I have reviewed the triage note and vital signs. Vital signs stable   Differential diagnosis includes, but is not limited to, pregnancy of unknown location, ectopic pregnancy, vaginal bleeding in early pregnancy, miscarriage, subchorionic hemorrhage  Patient's presentation is most consistent with acute presentation with potential threat to life or bodily function.  23 year old female presenting to the emergency department after experiencing vaginal bleeding last night and currently having abdominal pain today.  See HPI for  further details. LMP was 07/30/24.  Patient had refused labs when in triage.  I discussed the need and the rationale for the lab studies and the importance of determining her Rh status.  Patient refused all treatment, got up off the stretcher and walked out of the department.      FINAL CLINICAL IMPRESSION(S) / ED DIAGNOSES   Final diagnoses:  Abdominal pain during pregnancy, antepartum     Rx / DC Orders   ED Discharge Orders     None        Note:  This document was prepared using Dragon voice recognition software and may include unintentional dictation errors.   Herlinda Kirk NOVAK, FNP 08/26/24 1555    Viviann Pastor, MD 08/26/24 2156

## 2024-10-02 ENCOUNTER — Ambulatory Visit

## 2024-10-02 ENCOUNTER — Ambulatory Visit: Admitting: Family Medicine

## 2024-10-02 ENCOUNTER — Encounter

## 2024-10-02 DIAGNOSIS — Z113 Encounter for screening for infections with a predominantly sexual mode of transmission: Secondary | ICD-10-CM | POA: Diagnosis not present

## 2024-10-02 DIAGNOSIS — O98119 Syphilis complicating pregnancy, unspecified trimester: Secondary | ICD-10-CM | POA: Insufficient documentation

## 2024-10-02 MED ORDER — PENICILLIN G BENZATHINE 1200000 UNIT/2ML IM SUSY
2.4000 10*6.[IU] | PREFILLED_SYRINGE | INTRAMUSCULAR | Status: AC
  Administered 2024-10-02 – 2024-10-17 (×3): 2.4 10*6.[IU] via INTRAMUSCULAR

## 2024-10-02 NOTE — Progress Notes (Addendum)
 Pt is here for syphilis treatment. Declined female nurse, this RN gave 1.25miu given in RUOQ and 1.19miu given in LUOQ and tolerated well.  Reminded pt importance of keeping following appointments.  Pt declined to stay and wait 15 minutes.  Advised pt if she develops and symptoms such as hives, difficulty breathing, rash, etc. To go to ER. Pt verbalized understanding. Larraine JONELLE Novak, RN

## 2024-10-03 ENCOUNTER — Encounter: Payer: Self-pay | Admitting: Family Medicine

## 2024-10-03 NOTE — Progress Notes (Signed)
 " [facility] Department STI clinic [address], [address] [place] [address] Main phone: [phone]  STI screening visit  Subjective:  Katrina Sherman is a 24 y.o. female being seen today for an STI screening visit. The patient reports they do not have symptoms.    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   Obesity affecting pregnancy, antepartum 07/09/2023   Limited prenatal care 07/09/2023   History of congenital cardiac septal defect 07/09/2023   NSVD (normal spontaneous vaginal delivery) 07/09/2023   Precipitous delivery 07/09/2023   Vaginal bleeding in pregnancy 02/19/2023   Supervision of other normal pregnancy, antepartum 12/01/2022   Trichomoniasis 09/20/2022   Supervision of high risk pregnancy, antepartum 06/28/2020   Encounter for supervision of high risk pregnancy due to fetal anomaly, second trimester 06/28/2020   Pregnancy 05/01/2020   Chief Complaint  Patient presents with   SEXUALLY TRANSMITTED DISEASE    HPI Patient reports to clinic today for syphilis treatment. Declines exam, blood work and questions today. Pt reports I just want to get my shots that they're making me get and to leave. Pt chart review- pt is pregnant. Testing done with OB office.   Reproductive considerations Patient reports they are pregnant.  Patient's last menstrual period was 07/30/2024 (exact date).  See flowsheet for further details and programmatic requirements Hyperlink available at the top of the signed note in blue.  Flow sheet content below:  Pregnancy Intention Screening Does the patient want to become pregnant in the next year?: No Does the patient's partner want to become pregnant in the next year?: N/A Would the patient like to discuss contraceptive options today?: No Counseling Patient counseled to use condoms with all sex: Condoms declined RTC in  2-3 weeks for test results: Yes Clinic will call if test results abnormal before test result appt.: Yes Immunizations: Referred, Immunization history assessed Test results given to patient Patient counseled to use condoms with all sex: Condoms declined   Screening for MPX risk:  Does this patient meet CDC recommendations for vaccination against MPOX? No  You already have or anticipate having the following risks:  Your sex partner has the following risks: You're traveling to a county with a clade I MPOX outbreak and anticipate these risks: Occupational exposure  You had known or suspected exposure to someone with monkeypox You had a sex partner in the past 2 weeks who was diagnosed with monkeypox You are a gay, bisexual, or other man who has sex with men, or are transgender or nonbinary and in the past 6 months have had any of the following: - A new diagnosis of one or more sexually transmitted diseases (e.g., chlamydia, gonorrhea, or syphilis) - More than one sex partner You have had any of the following in the past 6 months: - Sex at a commercial sex venue (like a sex club or bathhouse) - Sex related to a large commercial event   or in a geographic area (city or county for example) where mpox virus transmission is occurring Sex with a new partner Sex at a commercial sex venue (e.g., a sex club or bathhouse) Sex in it consultant for money, goods, drugs, or other trade Sex in association with a large public event (e.g., a rave, party, or festival) i.e. certain people who work in a laboratory or healthcare facility   Infectious disease screenings: Vaccinated against HPV? Unknown  HIV Testing done with  OB 2025  Hep B Testing done with OB 2025  Hep C Testing done with OB 2025  Immunization history:  Immunization History  Administered Date(s) Administered   Tdap 07/10/2023    The following portions of the patient's history were reviewed and updated as appropriate: allergies, current  medications, past medical history, past social history, past surgical history and problem list.  Substance use screenings:  Substance questions not asked today   Objective:  There were no vitals filed for this visit.  Physical Exam -declined physical exam  Assessment and Plan:  Katrina Sherman is a 24 y.o. female presenting to the [facility] Department for STI screening.  Patient declined all testing- explained why I strongly recommended repeat testing today- declined.   1. Syphilis, antepartum (Primary) -needs 3 weekly injections of bic per [facility] consultant Tiera S.   - penicillin  g benzathine (BICILLIN  LA) 1200000 UNIT/2ML injection 2.4 Million Units   Counseling: RTC in 1 week for second set of shots  No follow-ups on file.  Future Appointments  Date Time Provider Department Center  10/09/2024  2:25 PM [facility] NURSE [facility] None  10/16/2024  2:25 PM [facility] NURSE [facility] None    Verneta Bers, FNP "

## 2024-10-09 ENCOUNTER — Ambulatory Visit

## 2024-10-09 ENCOUNTER — Encounter

## 2024-10-09 DIAGNOSIS — O98119 Syphilis complicating pregnancy, unspecified trimester: Secondary | ICD-10-CM

## 2024-10-09 DIAGNOSIS — Z113 Encounter for screening for infections with a predominantly sexual mode of transmission: Secondary | ICD-10-CM

## 2024-10-09 NOTE — Progress Notes (Signed)
 In nurse clinic for Syphilis treatment Bicllin #2. Pt states she notices knots in area where given bicillin  last week. Advised pt to massage the area or use warm compresses.  Pt voiced she also has not felt any flutters since she received last injections. Pt is [redacted]wks pregnant. Consulted with provider H.Middleton,Fnp who advised if having concerns f/u with OB/GYN [facility]. Denies any pain,mild cramping and no vaginal bleeding or spotting. Pt requested to get one inj at a time today. Tolerated well. Pt refused to stay for observation period. Advised if develops any difficulty breathing,rash or hives needs to go to ER.Pt advised when needs to return for bicllin #3. Bicillin  2.54mu/l given IM today per order dated 10/02/24 H.Middleton.

## 2024-10-16 ENCOUNTER — Encounter

## 2024-10-17 ENCOUNTER — Ambulatory Visit

## 2024-10-17 DIAGNOSIS — O98119 Syphilis complicating pregnancy, unspecified trimester: Secondary | ICD-10-CM

## 2024-10-17 NOTE — Progress Notes (Cosign Needed)
 In nurse clinic for syphilis treatment. Bicillin  #3.  K.Oppong,RN administered injections today.
